# Patient Record
Sex: Male | Born: 1960 | Race: White | Hispanic: No | Marital: Married | Smoking: Former smoker
Health system: Southern US, Community
[De-identification: ages and names within clinical notes are randomized; demographics above are authoritative.]

## PROBLEM LIST (undated history)

## (undated) DIAGNOSIS — E119 Type 2 diabetes mellitus without complications: Secondary | ICD-10-CM

## (undated) DIAGNOSIS — E785 Hyperlipidemia, unspecified: Secondary | ICD-10-CM

## (undated) DIAGNOSIS — I10 Essential (primary) hypertension: Secondary | ICD-10-CM

## (undated) DIAGNOSIS — E7800 Pure hypercholesterolemia, unspecified: Secondary | ICD-10-CM

## (undated) DIAGNOSIS — L570 Actinic keratosis: Secondary | ICD-10-CM

## (undated) DIAGNOSIS — N200 Calculus of kidney: Secondary | ICD-10-CM

## (undated) DIAGNOSIS — Z8673 Personal history of transient ischemic attack (TIA), and cerebral infarction without residual deficits: Secondary | ICD-10-CM

## (undated) DIAGNOSIS — I456 Pre-excitation syndrome: Secondary | ICD-10-CM

## (undated) HISTORY — DX: Hyperlipidemia, unspecified: E78.5

## (undated) HISTORY — PX: FOOT SURGERY: SHX648

## (undated) HISTORY — PX: HERNIA REPAIR: SHX51

## (undated) HISTORY — DX: Personal history of transient ischemic attack (TIA), and cerebral infarction without residual deficits: Z86.73

## (undated) HISTORY — PX: SPLENECTOMY: SUR1306

## (undated) HISTORY — DX: Actinic keratosis: L57.0

## (undated) HISTORY — DX: Calculus of kidney: N20.0

## (undated) HISTORY — PX: CARDIAC CATHETERIZATION: SHX172

---

## 2003-05-15 ENCOUNTER — Other Ambulatory Visit: Payer: Self-pay

## 2007-11-09 ENCOUNTER — Emergency Department: Payer: Self-pay | Admitting: Emergency Medicine

## 2010-09-08 ENCOUNTER — Ambulatory Visit: Payer: Self-pay

## 2011-04-12 ENCOUNTER — Emergency Department: Payer: Self-pay | Admitting: *Deleted

## 2011-04-12 LAB — COMPREHENSIVE METABOLIC PANEL
BUN: 13 mg/dL (ref 7–18)
Bilirubin,Total: 0.6 mg/dL (ref 0.2–1.0)
Chloride: 105 mmol/L (ref 98–107)
Co2: 26 mmol/L (ref 21–32)
EGFR (Non-African Amer.): >60
Glucose: 164 mg/dL — ABNORMAL HIGH (ref 65–99)
Osmolality: 281 (ref 275–301)
Potassium: 3.9 mmol/L (ref 3.5–5.1)
Sodium: 139 mmol/L (ref 136–145)

## 2011-04-12 LAB — URINALYSIS, COMPLETE
Glucose,UR: NEGATIVE mg/dL (ref 0–75)
Leukocyte Esterase: NEGATIVE
Ph: 5 (ref 4.5–8.0)
Protein: 25

## 2011-04-12 LAB — CBC
MCH: 31.8 pg (ref 26.0–34.0)
MCHC: 33.6 g/dL (ref 32.0–36.0)
MCV: 95 fL (ref 80–100)
Platelet: 464 10*3/uL — ABNORMAL HIGH (ref 150–440)
RDW: 12.9 % (ref 11.5–14.5)

## 2011-04-20 ENCOUNTER — Ambulatory Visit: Payer: Self-pay | Admitting: Urology

## 2012-06-01 ENCOUNTER — Encounter: Payer: Self-pay | Admitting: *Deleted

## 2012-07-11 ENCOUNTER — Encounter: Payer: Self-pay | Admitting: Internal Medicine

## 2012-07-11 ENCOUNTER — Ambulatory Visit (INDEPENDENT_AMBULATORY_CARE_PROVIDER_SITE_OTHER): Payer: BC Managed Care – PPO | Admitting: Internal Medicine

## 2012-07-11 VITALS — BP 124/88 | HR 72 | Ht 67.0 in | Wt 187.5 lb

## 2012-07-11 DIAGNOSIS — I456 Pre-excitation syndrome: Secondary | ICD-10-CM | POA: Insufficient documentation

## 2012-07-11 NOTE — Patient Instructions (Addendum)
Your physician wants you to follow-up in: as needed with Dr. Graciela Husbands. You will receive a reminder letter in the mail two months in advance. If you don't receive a letter, please call our office to schedule the follow-up appointment.

## 2012-07-11 NOTE — Progress Notes (Signed)
   ELECTROPHYSIOLOGY CONSULT NOTE  Patient ID: Melvin Torres, MRN: [id number], DOB/AGE: 07/15/1960 52 y.o. Admit date: (Not on file) Date of Consult: 07/11/2012  Primary Physician: Clydell Hakim, MD Primary Cardiologist: new   Chief Complaint: WPW   HPI Melvin Torres is a 52 y.o. male  Referred for consideration of treatment related to asymptomatic preexcitation identified on his electrocardiogram He has no history of palpitations. He has no syncope.  He has no problems with exercise tolerance without shortness of breath chest pain or peripheral edema.    Past Medical History  Diagnosis Date  . AK (actinic keratosis)   . Kidney stones   . History of TIA (transient ischemic attack)   . Hyperlipidemia       Surgical History:  Past Surgical History  Procedure Laterality Date  . Hernia repair      x2  . Splenectomy       Home Meds: Prior to Admission medications   Medication Sig Start Date End Date Taking? Authorizing Provider  aspirin 81 MG tablet Take 81 mg by mouth daily.   Yes Historical Provider, MD     Allergies: No Known Allergies  History   Social History  . Marital Status: Married    Spouse Name: N/A    Number of Children: N/A  . Years of Education: N/A   Occupational History  . Not on file.   Social History Main Topics  . Smoking status: Never Smoker   . Smokeless tobacco: Not on file  . Alcohol Use: No  . Drug Use: No  . Sexually Active: Not on file   Other Topics Concern  . Not on file   Social History Narrative  . No narrative on file     Family History  Problem Relation Age of Onset  . Heart disease Mother   . Heart attack Father   . Heart disease Father      ROS:  Please see the history of present illness.     All other systems reviewed and negative.    Physical Exam:   Blood pressure 124/88, pulse 72, height 5\' 7"  (1.702 m), weight 187 lb 8 oz (85.049 kg). General: Well developed, well nourished male in no acute  distress.  Alert and oriented in no acute distress HENT- normal Eyes- EOMI, without scleral icterus Skin- warm and dry; without rashes LN-neg Neck- supple without thyromegaly, JVP-flat, carotids brisk and full without bruits Back-without CVAT or kyphosis Lungs-clear to auscultation CV-Regular rate and rhythm, nl S1 and S2, no murmurs gallops or rubs, S4-absent Abd-soft with active bowel sounds; no midline pulsation or hepatomegaly Pulses-intact femoral and distal MKS-without gross deformity Neuro- Ax O, CN3-12 intact, grossly normal motor and sensory function Affect engaging    Labs:  BNP No results found for this basename: probnp   Miscellaneous No results found for this basename: DDIMER    Radiology/Studies:  No results found.  EKG: sinus rhythm at 72 Intervals 12/23/37 Ventricular preexcitation  Assessment and Plan: *   Sherryl Manges

## 2012-07-11 NOTE — Assessment & Plan Note (Signed)
The patient is asymptomatic ventricular preexcitation.  We reviewed the physiology and the concern is related to atrial fibrillation  And antegrade conduction over accessory pathway and the associated risk of cardiac arrest. It is thought  almost all patients present with palpitations and as such patients who are asymptomatic to be followed for the development of palpitations prior to needing to proceed with more definitive therapy.  At this juncture we will follow him along.

## 2012-08-15 ENCOUNTER — Ambulatory Visit: Payer: Self-pay | Admitting: Family Medicine

## 2012-08-15 LAB — DOT URINE DIP
Blood: NEGATIVE
Protein: NEGATIVE

## 2014-04-26 ENCOUNTER — Encounter: Payer: Self-pay | Admitting: Family Medicine

## 2014-04-26 DIAGNOSIS — E1142 Type 2 diabetes mellitus with diabetic polyneuropathy: Secondary | ICD-10-CM | POA: Insufficient documentation

## 2014-04-26 DIAGNOSIS — I10 Essential (primary) hypertension: Secondary | ICD-10-CM | POA: Insufficient documentation

## 2014-04-26 DIAGNOSIS — E785 Hyperlipidemia, unspecified: Secondary | ICD-10-CM | POA: Insufficient documentation

## 2014-04-26 DIAGNOSIS — G458 Other transient cerebral ischemic attacks and related syndromes: Secondary | ICD-10-CM | POA: Insufficient documentation

## 2014-04-26 DIAGNOSIS — Z9081 Acquired absence of spleen: Secondary | ICD-10-CM | POA: Insufficient documentation

## 2014-04-26 DIAGNOSIS — E1140 Type 2 diabetes mellitus with diabetic neuropathy, unspecified: Secondary | ICD-10-CM | POA: Insufficient documentation

## 2014-08-07 ENCOUNTER — Ambulatory Visit
Admission: EM | Admit: 2014-08-07 | Discharge: 2014-08-07 | Disposition: A | Discharge status: Home / Self Care | Payer: Self-pay | Attending: Emergency Medicine | Admitting: Emergency Medicine

## 2014-08-07 DIAGNOSIS — Z0000 Encounter for general adult medical examination without abnormal findings: Secondary | ICD-10-CM

## 2014-08-07 DIAGNOSIS — Z029 Encounter for administrative examinations, unspecified: Secondary | ICD-10-CM

## 2014-08-07 HISTORY — DX: Pure hypercholesterolemia, unspecified: E78.00

## 2014-08-07 HISTORY — DX: Essential (primary) hypertension: I10

## 2014-08-07 HISTORY — DX: Type 2 diabetes mellitus without complications: E11.9

## 2014-08-07 LAB — DEPT OF TRANSP DIPSTICK, URINE (ARMC ONLY)
Glucose, UA: NEGATIVE mg/dL
Hgb urine dipstick: NEGATIVE
Protein, ur: NEGATIVE mg/dL
Specific Gravity, Urine: 1.02 (ref 1.005–1.030)

## 2014-08-07 NOTE — ED Notes (Signed)
For DOT Physical

## 2014-08-07 NOTE — ED Provider Notes (Signed)
CSN: [id number]     Arrival date & time 08/07/14  5916 History   First MD Initiated Contact with Patient 08/07/14 1113     Chief Complaint  Patient presents with  . Employment Physical   (Consider location/radiation/quality/duration/timing/severity/associated sxs/prior Treatment) HPI  Patient presents for a DOT physical.  Past Medical History  Diagnosis Date  . AK (actinic keratosis)   . Kidney stones   . History of TIA (transient ischemic attack)   . Hyperlipidemia   . Diabetes mellitus without complication   . Hypercholesteremia   . Hypertension    Past Surgical History  Procedure Laterality Date  . Hernia repair      x2  . Splenectomy     Family History  Problem Relation Age of Onset  . Heart disease Mother   . Heart attack Father   . Heart disease Father    History  Substance Use Topics  . Smoking status: Never Smoker   . Smokeless tobacco: Not on file  . Alcohol Use: No    Review of Systems  All other systems reviewed and are negative.   Allergies  Review of patient's allergies indicates no known allergies.  Home Medications   Prior to Admission medications   Medication Sig Start Date End Date Taking? Authorizing Provider  aspirin 81 MG tablet Take 81 mg by mouth daily.   Yes Historical Provider, MD  lisinopril (PRINIVIL,ZESTRIL) 5 MG tablet Take 1 tablet by mouth daily. 03/26/14  Yes Historical Provider, MD  metFORMIN (GLUCOPHAGE) 1000 MG tablet Take 1 tablet by mouth 2 (two) times daily. 04/03/14  Yes Historical Provider, MD  simvastatin (ZOCOR) 40 MG tablet Take 1 tablet by mouth daily. 04/03/14  Yes Historical Provider, MD   BP 138/86 mmHg  Pulse 97  Temp(Src) 97.8 F (36.6 C) (Tympanic)  Resp 18  Ht 5' 7.4" (1.712 m)  Wt 180 lb (81.647 kg)  BMI 27.86 kg/m2  SpO2 100% Physical Exam  Constitutional:  Refer to DOT report  Nursing note and vitals reviewed.   ED Course  Procedures (including critical care time) Labs Review Labs Reviewed   DEPT OF TRANSP DIPSTICK, URINE([facility] ONLY)    Imaging Review No results found.   MDM   1. Physical exam    Patient was given one year certificate due to his hypertension and non-insulin-dependent diabetes mellitus. His TIA has been stable since age 54 and he has been released from further follow-up from the vascular department. He has not had any symptoms.    Lorin Picket, PA-C 08/07/14 1234

## 2014-10-03 ENCOUNTER — Ambulatory Visit (INDEPENDENT_AMBULATORY_CARE_PROVIDER_SITE_OTHER): Payer: BLUE CROSS/BLUE SHIELD | Admitting: Family Medicine

## 2014-10-03 ENCOUNTER — Encounter: Payer: Self-pay | Admitting: Family Medicine

## 2014-10-03 VITALS — BP 138/88 | HR 64 | Ht 67.0 in | Wt 178.2 lb

## 2014-10-03 DIAGNOSIS — Z23 Encounter for immunization: Secondary | ICD-10-CM | POA: Diagnosis not present

## 2014-10-03 DIAGNOSIS — E1140 Type 2 diabetes mellitus with diabetic neuropathy, unspecified: Secondary | ICD-10-CM

## 2014-10-03 DIAGNOSIS — E785 Hyperlipidemia, unspecified: Secondary | ICD-10-CM

## 2014-10-03 DIAGNOSIS — G458 Other transient cerebral ischemic attacks and related syndromes: Secondary | ICD-10-CM

## 2014-10-03 DIAGNOSIS — I10 Essential (primary) hypertension: Secondary | ICD-10-CM

## 2014-10-03 DIAGNOSIS — B354 Tinea corporis: Secondary | ICD-10-CM | POA: Diagnosis not present

## 2014-10-03 DIAGNOSIS — G459 Transient cerebral ischemic attack, unspecified: Secondary | ICD-10-CM

## 2014-10-03 MED ORDER — SIMVASTATIN 40 MG PO TABS: 40.0000 mg | ORAL_TABLET | Freq: Every day | ORAL | Status: DC

## 2014-10-03 MED ORDER — LISINOPRIL 10 MG PO TABS: 10.0000 mg | ORAL_TABLET | Freq: Every day | ORAL | Status: DC

## 2014-10-03 MED ORDER — METFORMIN HCL 1000 MG PO TABS: 1000.0000 mg | ORAL_TABLET | Freq: Two times a day (BID) | ORAL | Status: DC

## 2014-10-03 MED ORDER — CICLOPIROX OLAMINE 0.77 % EX CREA: TOPICAL_CREAM | Freq: Two times a day (BID) | CUTANEOUS | Status: DC

## 2014-10-03 NOTE — Progress Notes (Signed)
Date:  10/03/2014   Name:  Melvin KERNEY Sr.   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 Passed DOT PE recently but MD questioned when last carotid US done (none in 10 yrs). Using compounded gel of mother's for neuropathic pain prn, works well, wants to avoid another oral med. Has lesions L forearm wants evaluated, itch esp when exposed to sunlight. Due for flu shot and labs today. Saw cardiology re: WPW, wants to monitor only.  Review of Systems:  Review of Systems  Respiratory: Negative for shortness of breath.   Cardiovascular: Negative for chest pain and leg swelling.  Endocrine: Negative for polyuria.  Genitourinary: Negative for difficulty urinating.  Neurological: Negative for tremors, syncope and weakness.    Patient Active Problem List   Diagnosis Date Noted  . Tinea corporis 10/03/2014  . Diabetes mellitus, type 2 04/26/2014  . Diabetic peripheral neuropathy 04/26/2014  . HLD (hyperlipidemia) 04/26/2014  . HTN (hypertension) 04/26/2014  . H/O splenectomy 04/26/2014  . Anterior circulation transient ischemic attack 04/26/2014  . WPW (Wolff-Parkinson-White syndrome) 07/11/2012    Prior to Admission medications   Medication Sig Start Date End Date Taking? Authorizing Provider  aspirin 81 MG tablet Take 81 mg by mouth daily.   Yes Historical Provider, MD  metFORMIN (GLUCOPHAGE) 1000 MG tablet Take 1 tablet (1,000 mg total) by mouth 2 (two) times daily. 10/03/14  Yes Adline Potter, MD  simvastatin (ZOCOR) 40 MG tablet Take 1 tablet (40 mg total) by mouth daily. 10/03/14  Yes Adline Potter, MD  lisinopril (PRINIVIL,ZESTRIL) 10 MG tablet Take 1 tablet (10 mg total) by mouth daily. 10/03/14   Adline Potter, MD    No Known Allergies  Past Surgical History  Procedure Laterality Date   . Hernia repair      x2  . Splenectomy      Social History  Substance Use Topics  . Smoking status: Never Smoker   . Smokeless tobacco: None  . Alcohol Use: No    Family History  Problem Relation Age of Onset  . Heart disease Mother   . Heart attack Father   . Heart disease Father     Medication list has been reviewed and updated.  Physical Examination: BP 138/88 mmHg  Pulse 64  Ht 5\' 7"  (1.702 m)  Wt 178 lb 3.2 oz (80.831 kg)  BMI 27.90 kg/m2  Physical Exam  Constitutional: He appears well-developed and well-nourished.  Neck:  No carotid bruits  Cardiovascular: Normal rate, regular rhythm and normal heart sounds.   Pulmonary/Chest: Effort normal and breath sounds normal.  Musculoskeletal: He exhibits no edema.  Neurological: He is alert. Coordination normal.  Skin: Skin is warm and dry.  Scattered scaly erythematous plaques over L forearm  Psychiatric: He has a normal mood and affect. His behavior is normal.    Assessment and Plan:  1. Type 2 diabetes mellitus with diabetic neuropathy Well controlled on metformin, check labs today, ok to use topical gel for neuropathic sxs - HgB A1c - Urine Microalbumin w/creat. ratio  2. Essential hypertension Marginal control for diabetic, increase lisinopril to 10 mg daily - Comprehensive metabolic panel - CBC  3. HLD (hyperlipidemia) Zocor new, tolerating clinically, check labs - Lipid Profile  4. Anterior circulation transient ischemic attack Continue asa, check  carotid US - US Carotid Duplex Bilateral; Future  5. Tinea corporis Trial Loprox, consider derm referral for AK's if fail to clear given persistent L forearm sun exposure   6. Flu vaccine need - Flu Vaccine QUAD 36+ mos PF IM (Fluarix & Fluzone Quad PF)   Return in about 6 months (around 04/02/2015).  Satira Anis. [facility]  10/03/2014

## 2014-10-04 ENCOUNTER — Other Ambulatory Visit: Payer: Self-pay

## 2014-10-04 LAB — LIPID PANEL
CHOL/HDL RATIO: 4.1 ratio (ref 0.0–5.0)
Cholesterol, Total: 191 mg/dL (ref 100–199)
HDL: 47 mg/dL (ref >39)
LDL CALC: 109 mg/dL — AB (ref 0–99)
Triglycerides: 174 mg/dL — ABNORMAL HIGH (ref 0–149)
VLDL Cholesterol Cal: 35 mg/dL (ref 5–40)

## 2014-10-04 LAB — CBC
Hematocrit: 45.3 % (ref 37.5–51.0)
Hemoglobin: 15.4 g/dL (ref 12.6–17.7)
MCH: 31.6 pg (ref 26.6–33.0)
MCHC: 34 g/dL (ref 31.5–35.7)
MCV: 93 fL (ref 79–97)
PLATELETS: 571 10*3/uL — AB (ref 150–379)
RBC: 4.88 x10E6/uL (ref 4.14–5.80)
RDW: 13.5 % (ref 12.3–15.4)
WBC: 9.1 10*3/uL (ref 3.4–10.8)

## 2014-10-04 LAB — COMPREHENSIVE METABOLIC PANEL
ALBUMIN: 4.4 g/dL (ref 3.5–5.5)
ALT: 21 IU/L (ref 0–44)
AST: 16 IU/L (ref 0–40)
Albumin/Globulin Ratio: 1.8 (ref 1.1–2.5)
Alkaline Phosphatase: 63 IU/L (ref 39–117)
BILIRUBIN TOTAL: 0.3 mg/dL (ref 0.0–1.2)
BUN / CREAT RATIO: 11 (ref 9–20)
BUN: 12 mg/dL (ref 6–24)
CO2: 25 mmol/L (ref 18–29)
Calcium: 9.7 mg/dL (ref 8.7–10.2)
Chloride: 99 mmol/L (ref 97–108)
Creatinine, Ser: 1.14 mg/dL (ref 0.76–1.27)
GFR calc Af Amer: 84 mL/min/{1.73_m2} (ref >59)
GFR calc non Af Amer: 73 mL/min/{1.73_m2} (ref >59)
GLOBULIN, TOTAL: 2.5 g/dL (ref 1.5–4.5)
GLUCOSE: 154 mg/dL — AB (ref 65–99)
Potassium: 4.5 mmol/L (ref 3.5–5.2)
SODIUM: 140 mmol/L (ref 134–144)
Total Protein: 6.9 g/dL (ref 6.0–8.5)

## 2014-10-04 LAB — MICROALBUMIN / CREATININE URINE RATIO
CREATININE, UR: 98 mg/dL
MICROALB/CREAT RATIO: 6.2 mg/g creat (ref 0.0–30.0)
Microalbumin, Urine: 6.1 ug/mL

## 2014-10-04 LAB — HEMOGLOBIN A1C
ESTIMATED AVERAGE GLUCOSE: 180 mg/dL
Hgb A1c MFr Bld: 7.9 % — ABNORMAL HIGH (ref 4.8–5.6)

## 2014-10-04 MED ORDER — GLIPIZIDE 5 MG PO TABS: 5.0000 mg | ORAL_TABLET | Freq: Every day | ORAL | Status: DC

## 2014-10-04 NOTE — Addendum Note (Signed)
Addended by: Adline Potter on: 10/04/2014 09:24 AM   Modules accepted: Orders

## 2014-11-02 ENCOUNTER — Ambulatory Visit
Admission: RE | Admit: 2014-11-02 | Discharge: 2014-11-02 | Disposition: A | Discharge status: Home / Self Care | Payer: BLUE CROSS/BLUE SHIELD | Source: Ambulatory Visit | Attending: Family Medicine | Admitting: Family Medicine

## 2014-11-02 DIAGNOSIS — G458 Other transient cerebral ischemic attacks and related syndromes: Secondary | ICD-10-CM

## 2014-11-02 DIAGNOSIS — G459 Transient cerebral ischemic attack, unspecified: Secondary | ICD-10-CM | POA: Insufficient documentation

## 2014-11-02 DIAGNOSIS — I6523 Occlusion and stenosis of bilateral carotid arteries: Secondary | ICD-10-CM | POA: Insufficient documentation

## 2015-04-02 ENCOUNTER — Ambulatory Visit: Payer: BLUE CROSS/BLUE SHIELD | Admitting: Family Medicine

## 2015-04-12 ENCOUNTER — Ambulatory Visit (INDEPENDENT_AMBULATORY_CARE_PROVIDER_SITE_OTHER): Payer: BLUE CROSS/BLUE SHIELD | Admitting: Family Medicine

## 2015-04-12 ENCOUNTER — Telehealth: Payer: Self-pay

## 2015-04-12 ENCOUNTER — Encounter: Payer: Self-pay | Admitting: Family Medicine

## 2015-04-12 VITALS — BP 124/84 | HR 76 | Resp 16 | Wt 184.0 lb

## 2015-04-12 DIAGNOSIS — E785 Hyperlipidemia, unspecified: Secondary | ICD-10-CM | POA: Diagnosis not present

## 2015-04-12 DIAGNOSIS — I10 Essential (primary) hypertension: Secondary | ICD-10-CM

## 2015-04-12 DIAGNOSIS — E663 Overweight: Secondary | ICD-10-CM | POA: Diagnosis not present

## 2015-04-12 DIAGNOSIS — Z23 Encounter for immunization: Secondary | ICD-10-CM | POA: Diagnosis not present

## 2015-04-12 DIAGNOSIS — B354 Tinea corporis: Secondary | ICD-10-CM

## 2015-04-12 DIAGNOSIS — I6522 Occlusion and stenosis of left carotid artery: Secondary | ICD-10-CM

## 2015-04-12 DIAGNOSIS — E1140 Type 2 diabetes mellitus with diabetic neuropathy, unspecified: Secondary | ICD-10-CM

## 2015-04-12 MED ORDER — BLOOD GLUCOSE METER KIT: PACK | Status: AC

## 2015-04-12 NOTE — Addendum Note (Signed)
Addended by: Adline Potter on: 04/12/2015 10:53 AM   Modules accepted: Orders

## 2015-04-12 NOTE — Telephone Encounter (Signed)
Sent to [place] 

## 2015-04-13 LAB — COMPREHENSIVE METABOLIC PANEL
ALT: 17 IU/L (ref 0–44)
AST: 16 IU/L (ref 0–40)
Albumin/Globulin Ratio: 1.4 (ref 1.2–2.2)
Albumin: 4.2 g/dL (ref 3.5–5.5)
Alkaline Phosphatase: 65 IU/L (ref 39–117)
BUN / CREAT RATIO: 9 (ref 9–20)
BUN: 10 mg/dL (ref 6–24)
Bilirubin Total: 0.4 mg/dL (ref 0.0–1.2)
CALCIUM: 10.1 mg/dL (ref 8.7–10.2)
CHLORIDE: 100 mmol/L (ref 96–106)
CO2: 20 mmol/L (ref 18–29)
Creatinine, Ser: 1.1 mg/dL (ref 0.76–1.27)
GFR calc non Af Amer: 75 mL/min/{1.73_m2} (ref >59)
GFR, EST AFRICAN AMERICAN: 87 mL/min/{1.73_m2} (ref >59)
GLOBULIN, TOTAL: 2.9 g/dL (ref 1.5–4.5)
Glucose: 107 mg/dL — ABNORMAL HIGH (ref 65–99)
POTASSIUM: 4.8 mmol/L (ref 3.5–5.2)
SODIUM: 141 mmol/L (ref 134–144)
TOTAL PROTEIN: 7.1 g/dL (ref 6.0–8.5)

## 2015-04-13 LAB — HEMOGLOBIN A1C
Est. average glucose Bld gHb Est-mCnc: 177 mg/dL
HEMOGLOBIN A1C: 7.8 % — AB (ref 4.8–5.6)

## 2015-04-13 LAB — VITAMIN D 25 HYDROXY (VIT D DEFICIENCY, FRACTURES): VIT D 25 HYDROXY: 29.6 ng/mL — AB (ref 30.0–100.0)

## 2015-04-13 LAB — VITAMIN B12: VITAMIN B 12: 418 pg/mL (ref 211–946)

## 2015-04-13 LAB — LIPID PANEL
Chol/HDL Ratio: 3.4 ratio units (ref 0.0–5.0)
Cholesterol, Total: 185 mg/dL (ref 100–199)
HDL: 54 mg/dL (ref >39)
LDL Calculated: 112 mg/dL — ABNORMAL HIGH (ref 0–99)
Triglycerides: 95 mg/dL (ref 0–149)
VLDL Cholesterol Cal: 19 mg/dL (ref 5–40)

## 2015-04-16 MED ORDER — BLOOD GLUCOSE MONITOR KIT: PACK | Status: DC

## 2015-04-16 MED ORDER — ATORVASTATIN CALCIUM 40 MG PO TABS: 40.0000 mg | ORAL_TABLET | Freq: Every day | ORAL | Status: DC

## 2015-04-16 MED ORDER — GLIPIZIDE 5 MG PO TABS: 5.0000 mg | ORAL_TABLET | Freq: Two times a day (BID) | ORAL | Status: DC

## 2015-04-16 NOTE — Addendum Note (Signed)
Addended by: Adline Potter on: 04/16/2015 04:38 PM   Modules accepted: Orders, Medications

## 2015-04-16 NOTE — Progress Notes (Signed)
Date:  04/12/2015   Name:  Melvin YAMADA Sr.   DOB:  [birth year]   MRN:  [id number]  PCP:  Adline Potter, MD    Chief Complaint: Diabetes and Rash   History of Present Illness:  This is a 55 y.o. male seen for 6 month f/u. A1c 7.9% in September on glipizide and metformin, not check home BG's. Neuropathy sxs a little worse but reluctant to take another med. Lisinopril dose increased last visit, tolerating well. On Zocor for HLD. Recent carotid US showed mod L sided stenosis (about 50%). Has lesions on arms unresponsive to Loprox.  Review of Systems:  Review of Systems  Constitutional: Negative for fever and fatigue.  Respiratory: Negative for cough and shortness of breath.   Cardiovascular: Negative for chest pain and leg swelling.  Endocrine: Negative for polyuria.  Genitourinary: Negative for difficulty urinating.  Neurological: Negative for syncope and light-headedness.    Patient Active Problem List   Diagnosis Date Noted  . Overweight 04/12/2015  . Left carotid stenosis 04/12/2015  . Tinea corporis 10/03/2014  . Type 2 diabetes mellitus with diabetic neuropathy ([place]) 04/26/2014  . HLD (hyperlipidemia) 04/26/2014  . HTN (hypertension) 04/26/2014  . H/O splenectomy 04/26/2014  . Anterior circulation transient ischemic attack 04/26/2014  . WPW (Wolff-Parkinson-White syndrome) 07/11/2012    Prior to Admission medications   Medication Sig Start Date End Date Taking? Authorizing Provider  aspirin 81 MG tablet Take 81 mg by mouth daily.   Yes Historical Provider, MD  ciclopirox (LOPROX) 0.77 % cream Apply topically 2 (two) times daily. 10/03/14  Yes Adline Potter, MD  glipiZIDE (GLUCOTROL) 5 MG tablet Take 1 tablet (5 mg total) by mouth daily before breakfast. 10/04/14  Yes Adline Potter, MD  lisinopril (PRINIVIL,ZESTRIL) 10 MG tablet Take 1 tablet (10 mg total) by mouth daily. 10/03/14  Yes Adline Potter, MD  metFORMIN (GLUCOPHAGE) 1000 MG tablet Take 1 tablet (1,000 mg total)  by mouth 2 (two) times daily. 10/03/14  Yes Adline Potter, MD  simvastatin (ZOCOR) 40 MG tablet Take 1 tablet (40 mg total) by mouth daily. 10/03/14  Yes Adline Potter, MD  blood glucose meter kit and supplies Dispense based on patient and insurance preference. Use up to four times daily as directed. (FOR ICD-9 250.00, 250.01). 04/12/15   Adline Potter, MD    No Known Allergies  Past Surgical History  Procedure Laterality Date  . Hernia repair      x2  . Splenectomy      Social History  Substance Use Topics  . Smoking status: Never Smoker   . Smokeless tobacco: None  . Alcohol Use: No    Family History  Problem Relation Age of Onset  . Heart disease Mother   . Heart attack Father   . Heart disease Father     Medication list has been reviewed and updated.  Physical Examination: BP 124/84 mmHg  Pulse 76  Resp 16  Wt 184 lb (83.462 kg)  SpO2 96%  Physical Exam  Constitutional: He appears well-developed and well-nourished.  Cardiovascular: Normal rate, regular rhythm and normal heart sounds.   Pulmonary/Chest: Effort normal and breath sounds normal.  Musculoskeletal: He exhibits no edema.  Neurological: He is alert.  Skin: Skin is warm and dry.  Psychiatric: He has a normal mood and affect. His behavior is normal.  Nursing note and vitals reviewed.   Assessment and Plan:  1. Type 2 diabetes mellitus with diabetic neuropathy, without long-term current use of insulin ([place])  Well controlled on glipizide/metformin, rx for glucometer sent, consider gabapentin if neuropathy sxs worsen - HgB A1c - B12  2. Overweight Exercise/weight loss discussed - Vitamin D (25 hydroxy)  3. Essential hypertension Well controlled on lisinopril - Comprehensive Metabolic Panel (CMET)  4. Left carotid stenosis Stable, cont asa/statin  5. HLD (hyperlipidemia) On Zocor - Lipid Profile  6. Need for pneumococcal vaccination - Pneumococcal polysaccharide vaccine 23-valent greater than  or equal to 2yo subcutaneous/IM  7. Tinea corporis Unresponsive to topical antifungal, refer derm  Return in about 3 months (around 07/12/2015).  Satira Anis. [facility]  04/16/2015

## 2015-05-10 DIAGNOSIS — L538 Other specified erythematous conditions: Secondary | ICD-10-CM | POA: Diagnosis not present

## 2015-05-10 DIAGNOSIS — L570 Actinic keratosis: Secondary | ICD-10-CM | POA: Diagnosis not present

## 2015-05-10 DIAGNOSIS — Z789 Other specified health status: Secondary | ICD-10-CM | POA: Diagnosis not present

## 2015-05-10 DIAGNOSIS — L298 Other pruritus: Secondary | ICD-10-CM | POA: Diagnosis not present

## 2015-05-10 DIAGNOSIS — B078 Other viral warts: Secondary | ICD-10-CM | POA: Diagnosis not present

## 2015-06-06 DIAGNOSIS — L570 Actinic keratosis: Secondary | ICD-10-CM | POA: Diagnosis not present

## 2015-07-04 DIAGNOSIS — L570 Actinic keratosis: Secondary | ICD-10-CM | POA: Diagnosis not present

## 2015-07-12 ENCOUNTER — Ambulatory Visit (INDEPENDENT_AMBULATORY_CARE_PROVIDER_SITE_OTHER): Payer: BLUE CROSS/BLUE SHIELD | Admitting: Family Medicine

## 2015-07-12 ENCOUNTER — Encounter: Payer: Self-pay | Admitting: Family Medicine

## 2015-07-12 VITALS — BP 148/90 | HR 78 | Ht 64.0 in | Wt 187.0 lb

## 2015-07-12 DIAGNOSIS — I10 Essential (primary) hypertension: Secondary | ICD-10-CM | POA: Diagnosis not present

## 2015-07-12 DIAGNOSIS — E669 Obesity, unspecified: Secondary | ICD-10-CM | POA: Diagnosis not present

## 2015-07-12 DIAGNOSIS — E785 Hyperlipidemia, unspecified: Secondary | ICD-10-CM | POA: Diagnosis not present

## 2015-07-12 DIAGNOSIS — E1140 Type 2 diabetes mellitus with diabetic neuropathy, unspecified: Secondary | ICD-10-CM | POA: Diagnosis not present

## 2015-07-12 DIAGNOSIS — E66811 Obesity, class 1: Secondary | ICD-10-CM | POA: Insufficient documentation

## 2015-07-12 MED ORDER — LISINOPRIL 20 MG PO TABS: 20.0000 mg | ORAL_TABLET | Freq: Every day | ORAL | Status: DC

## 2015-07-12 NOTE — Progress Notes (Signed)
Date:  07/12/2015   Name:  Melvin LAURSEN Sr.   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 T2DM marginally controlled on metformin/glipizide, glipizide dose increased after last visit. HLD marginally controlled on Zocor, changed to Lipitor after last visit but has not changed over yet as waiting to use up Zocor rx. Seeing derm for multiple precancerous L arm lesions. Reports FBG's around 120.  Review of Systems:  Review of Systems  Constitutional: Negative for fever and fatigue.  Respiratory: Negative for cough and shortness of breath.   Cardiovascular: Negative for chest pain and leg swelling.  Endocrine: Negative for polyuria.  Genitourinary: Negative for difficulty urinating.  Neurological: Negative for syncope and light-headedness.    Patient Active Problem List   Diagnosis Date Noted  . Obesity, Class I, BMI 30-34.9 07/12/2015  . Overweight 04/12/2015  . Left carotid stenosis 04/12/2015  . Tinea corporis 10/03/2014  . Type 2 diabetes mellitus with diabetic neuropathy ([place]) 04/26/2014  . HLD (hyperlipidemia) 04/26/2014  . HTN (hypertension) 04/26/2014  . H/O splenectomy 04/26/2014  . Anterior circulation transient ischemic attack 04/26/2014  . WPW (Wolff-Parkinson-White syndrome) 07/11/2012    Prior to Admission medications   Medication Sig Start Date End Date Taking? Authorizing Provider  aspirin 81 MG tablet Take 81 mg by mouth daily.   Yes Historical Provider, MD  atorvastatin (LIPITOR) 40 MG tablet Take 1 tablet (40 mg total) by mouth daily. 04/16/15  Yes Adline Potter, MD  blood glucose meter kit and supplies Dispense based on patient and insurance preference. Use up to four times daily as directed. (FOR ICD-9 250.00, 250.01). 04/12/15  Yes Adline Potter, MD  glipiZIDE (GLUCOTROL) 5 MG tablet Take  1 tablet (5 mg total) by mouth 2 (two) times daily before a meal. 04/16/15  Yes Adline Potter, MD  lisinopril (PRINIVIL,ZESTRIL) 20 MG tablet Take 1 tablet (20 mg total) by mouth daily. 07/12/15  Yes Adline Potter, MD  metFORMIN (GLUCOPHAGE) 1000 MG tablet Take 1 tablet (1,000 mg total) by mouth 2 (two) times daily. 10/03/14  Yes Adline Potter, MD    No Known Allergies  Past Surgical History  Procedure Laterality Date  . Hernia repair      x2  . Splenectomy      Social History  Substance Use Topics  . Smoking status: Never Smoker   . Smokeless tobacco: None  . Alcohol Use: No    Family History  Problem Relation Age of Onset  . Heart disease Mother   . Heart attack Father   . Heart disease Father     Medication list has been reviewed and updated.  Physical Examination: BP 148/90 mmHg  Pulse 78  Ht '5\' 4"'  (1.626 m)  Wt 187 lb (84.823 kg)  BMI 32.08 kg/m2  Physical Exam  Constitutional: He appears well-developed and well-nourished.  Cardiovascular: Normal rate, regular rhythm and normal heart sounds.   Pulmonary/Chest: Effort normal and breath sounds normal.  Musculoskeletal: He exhibits no edema.  Neurological: He is alert.  Skin: Skin is warm and dry.  Psychiatric: He has a normal mood and affect. His behavior is normal.  Nursing note and vitals reviewed.   Assessment and Plan:  1. Type 2 diabetes mellitus with diabetic neuropathy, without long-term current use of insulin ([name]) Unclear control on  increased glipizide - HgB A1c  2. Essential hypertension Poor control today, increase lisinopril to 20 mg daily  3. HLD (hyperlipidemia) Marginal control on Zocor, change to Lipitor 40 mg daily  4. Obesity, Class I, BMI 30-34.9 Exercise/weight loss discussed - TSH  Return in about 3 months (around 10/12/2015).  Satira Anis. [facility]  07/12/2015

## 2015-07-13 LAB — HEMOGLOBIN A1C
ESTIMATED AVERAGE GLUCOSE: 180 mg/dL
HEMOGLOBIN A1C: 7.9 % — AB (ref 4.8–5.6)

## 2015-07-13 LAB — TSH: TSH: 1.18 u[IU]/mL (ref 0.450–4.500)

## 2015-07-14 ENCOUNTER — Other Ambulatory Visit: Payer: Self-pay | Admitting: Family Medicine

## 2015-07-14 MED ORDER — GLIPIZIDE 10 MG PO TABS: 10.0000 mg | ORAL_TABLET | Freq: Two times a day (BID) | ORAL | Status: DC

## 2015-07-22 ENCOUNTER — Ambulatory Visit
Admission: EM | Admit: 2015-07-22 | Discharge: 2015-07-22 | Disposition: A | Discharge status: Home / Self Care | Payer: BLUE CROSS/BLUE SHIELD | Attending: Family Medicine | Admitting: Family Medicine

## 2015-07-22 ENCOUNTER — Other Ambulatory Visit: Payer: Self-pay

## 2015-07-22 DIAGNOSIS — Z0289 Encounter for other administrative examinations: Secondary | ICD-10-CM

## 2015-07-22 LAB — DEPT OF TRANSP DIPSTICK, URINE (ARMC ONLY)
Glucose, UA: NEGATIVE mg/dL
Hgb urine dipstick: NEGATIVE
Protein, ur: NEGATIVE mg/dL
Specific Gravity, Urine: 1.005 (ref 1.005–1.030)

## 2015-07-22 NOTE — ED Provider Notes (Signed)
CSN: [id number]     Arrival date & time 07/22/15  7915 History   First MD Initiated Contact with Patient 07/22/15 [phone]     Chief Complaint  Patient presents with  . Commercial Driver's License Exam   (Consider location/radiation/quality/duration/timing/severity/associated sxs/prior Treatment) HPI Comments: Patient here for DOT Physical (see scanned form)   The history is provided by the patient.    Past Medical History  Diagnosis Date  . AK (actinic keratosis)   . Kidney stones   . History of TIA (transient ischemic attack)   . Hyperlipidemia   . Diabetes mellitus without complication ([place])   . Hypercholesteremia   . Hypertension    Past Surgical History  Procedure Laterality Date  . Hernia repair      x2  . Splenectomy    . Foot surgery     Family History  Problem Relation Age of Onset  . Heart disease Mother   . Heart attack Father   . Heart disease Father    Social History  Substance Use Topics  . Smoking status: Former Research scientist (life sciences)  . Smokeless tobacco: None  . Alcohol Use: No    Review of Systems  Allergies  Review of patient's allergies indicates no known allergies.  Home Medications   Prior to Admission medications   Medication Sig Start Date End Date Taking? Authorizing Provider  aspirin 81 MG tablet Take 81 mg by mouth daily.   Yes Historical Provider, MD  atorvastatin (LIPITOR) 40 MG tablet Take 1 tablet (40 mg total) by mouth daily. 04/16/15  Yes Adline Potter, MD  blood glucose meter kit and supplies Dispense based on patient and insurance preference. Use up to four times daily as directed. (FOR ICD-9 250.00, 250.01). 04/12/15  Yes Adline Potter, MD  glipiZIDE (GLUCOTROL) 10 MG tablet Take 1 tablet (10 mg total) by mouth 2 (two) times daily before a meal. 07/14/15  Yes Adline Potter, MD  lisinopril (PRINIVIL,ZESTRIL) 20 MG tablet Take 1 tablet (20 mg total) by mouth daily. 07/12/15  Yes Adline Potter, MD  metFORMIN (GLUCOPHAGE) 1000 MG tablet Take 1 tablet  (1,000 mg total) by mouth 2 (two) times daily. 10/03/14  Yes Adline Potter, MD   Meds Ordered and Administered this Visit  Medications - No data to display  BP 155/84 mmHg  Pulse 96  Temp(Src) 97.7 F (36.5 C) (Oral)  Resp 17  Ht '5\' 7"'  (1.702 m)  Wt 187 lb (84.823 kg)  BMI 29.28 kg/m2  SpO2 100% No data found.   Physical Exam  ED Course  Procedures (including critical care time)  Labs Review Labs Reviewed  DEPT OF TRANSP DIPSTICK, URINE([facility] ONLY)    Imaging Review No results found.   Visual Acuity Review  Right Eye Distance: 20/30 Left Eye Distance: 20/25 Bilateral Distance: 20/20  Right Eye Near:   Left Eye Near:    Bilateral Near:         MDM   1. Encounter for examination required by Department of Transportation (DOT)     DOT Physical (medically qualified for 1 year; see scanned form)    Norval Gable, MD 07/22/15 1015

## 2015-07-22 NOTE — ED Notes (Signed)
DOT Physical

## 2015-08-23 ENCOUNTER — Emergency Department: Payer: BLUE CROSS/BLUE SHIELD

## 2015-08-23 ENCOUNTER — Emergency Department
Admission: EM | Admit: 2015-08-23 | Discharge: 2015-08-24 | Disposition: A | Discharge status: Home / Self Care | Payer: BLUE CROSS/BLUE SHIELD | Attending: Emergency Medicine | Admitting: Emergency Medicine

## 2015-08-23 ENCOUNTER — Encounter: Payer: Self-pay | Admitting: Emergency Medicine

## 2015-08-23 DIAGNOSIS — Z7984 Long term (current) use of oral hypoglycemic drugs: Secondary | ICD-10-CM | POA: Insufficient documentation

## 2015-08-23 DIAGNOSIS — I10 Essential (primary) hypertension: Secondary | ICD-10-CM | POA: Insufficient documentation

## 2015-08-23 DIAGNOSIS — E119 Type 2 diabetes mellitus without complications: Secondary | ICD-10-CM | POA: Diagnosis not present

## 2015-08-23 DIAGNOSIS — N200 Calculus of kidney: Secondary | ICD-10-CM

## 2015-08-23 DIAGNOSIS — Z7982 Long term (current) use of aspirin: Secondary | ICD-10-CM | POA: Insufficient documentation

## 2015-08-23 DIAGNOSIS — Z79899 Other long term (current) drug therapy: Secondary | ICD-10-CM | POA: Insufficient documentation

## 2015-08-23 DIAGNOSIS — N132 Hydronephrosis with renal and ureteral calculous obstruction: Secondary | ICD-10-CM | POA: Diagnosis not present

## 2015-08-23 DIAGNOSIS — Z87891 Personal history of nicotine dependence: Secondary | ICD-10-CM | POA: Insufficient documentation

## 2015-08-23 DIAGNOSIS — R109 Unspecified abdominal pain: Secondary | ICD-10-CM | POA: Diagnosis present

## 2015-08-23 LAB — CBC WITH DIFFERENTIAL/PLATELET
BASOS ABS: 0.1 10*3/uL (ref 0–0.1)
BASOS PCT: 1 %
EOS ABS: 0.4 10*3/uL (ref 0–0.7)
Eosinophils Relative: 3 %
HCT: 43.1 % (ref 40.0–52.0)
HEMOGLOBIN: 14.7 g/dL (ref 13.0–18.0)
Lymphocytes Relative: 47 %
Lymphs Abs: 6.2 10*3/uL — ABNORMAL HIGH (ref 1.0–3.6)
MCH: 31.4 pg (ref 26.0–34.0)
MCHC: 34.2 g/dL (ref 32.0–36.0)
MCV: 91.8 fL (ref 80.0–100.0)
Monocytes Absolute: 1.1 10*3/uL — ABNORMAL HIGH (ref 0.2–1.0)
Monocytes Relative: 8 %
NEUTROS PCT: 41 %
Neutro Abs: 5.5 10*3/uL (ref 1.4–6.5)
Platelets: 469 10*3/uL — ABNORMAL HIGH (ref 150–440)
RBC: 4.69 MIL/uL (ref 4.40–5.90)
RDW: 13.1 % (ref 11.5–14.5)
WBC: 13.3 10*3/uL — AB (ref 3.8–10.6)

## 2015-08-23 LAB — BASIC METABOLIC PANEL
ANION GAP: 8 (ref 5–15)
BUN: 16 mg/dL (ref 6–20)
CHLORIDE: 104 mmol/L (ref 101–111)
CO2: 24 mmol/L (ref 22–32)
Calcium: 8.9 mg/dL (ref 8.9–10.3)
Creatinine, Ser: 1.53 mg/dL — ABNORMAL HIGH (ref 0.61–1.24)
GFR calc Af Amer: 57 mL/min — ABNORMAL LOW (ref >60)
GFR calc non Af Amer: 50 mL/min — ABNORMAL LOW (ref >60)
Glucose, Bld: 173 mg/dL — ABNORMAL HIGH (ref 65–99)
POTASSIUM: 4.1 mmol/L (ref 3.5–5.1)
SODIUM: 136 mmol/L (ref 135–145)

## 2015-08-23 MED ORDER — ONDANSETRON HCL 4 MG/2ML IJ SOLN
INTRAMUSCULAR | Status: AC
  Administered 2015-08-23: 4 mg via INTRAVENOUS
  Filled 2015-08-23: qty 2

## 2015-08-23 MED ORDER — SODIUM CHLORIDE 0.9 % IV BOLUS (SEPSIS)
1000.0000 mL | Freq: Once | INTRAVENOUS | Status: AC
  Administered 2015-08-23: 1000 mL via INTRAVENOUS

## 2015-08-23 MED ORDER — ONDANSETRON HCL 4 MG/2ML IJ SOLN
4.0000 mg | Freq: Once | INTRAMUSCULAR | Status: AC
  Administered 2015-08-23: 4 mg via INTRAVENOUS

## 2015-08-23 MED ORDER — KETOROLAC TROMETHAMINE 30 MG/ML IJ SOLN
INTRAMUSCULAR | Status: AC
  Administered 2015-08-23: 30 mg via INTRAVENOUS
  Filled 2015-08-23: qty 1

## 2015-08-23 MED ORDER — KETOROLAC TROMETHAMINE 30 MG/ML IJ SOLN
30.0000 mg | Freq: Once | INTRAMUSCULAR | Status: AC
  Administered 2015-08-23: 30 mg via INTRAVENOUS

## 2015-08-23 NOTE — ED Triage Notes (Signed)
Pt. States hx of kidney stones.  Pt. States lt. Flank pain with dysuria.

## 2015-08-23 NOTE — ED Provider Notes (Signed)
[facility] Emergency Department Provider Note   ____________________________________________   First MD Initiated Contact with Patient 08/23/15 2141     (approximate)  I have reviewed the triage vital signs and the nursing notes.   HISTORY  Chief Complaint Flank Pain (Pt. states lt. flank pain for the past 5 days, worse today.  Dysuria and hx of kidney stones.)   HPI Melvin GARRIDO Sr. is a 55 y.o. male with a history of diabetes as well as hypertension and kidney stones who is presenting to the emergency department today with left flank/lower back pain. He says that he has had pain over the past several days but acutely worsened about 2 hours ago. He says it is now 9 out of 10 and sharp pain. He says that he also has some burning with urination. He says he has had stones in the past which have presented similarly. He said he took hydrocodone at home which did not help with his pain control. Needed a procedure in the past to pass a stone. He has been seen in the past at [facility] urology.  Patient says that he has had difficulty finding a position of comfort and the pain does not worsen with movement.   Past Medical History:  Diagnosis Date  . AK (actinic keratosis)   . Diabetes mellitus without complication ([place])   . History of TIA (transient ischemic attack)   . Hypercholesteremia   . Hyperlipidemia   . Hypertension   . Kidney stones     Patient Active Problem List   Diagnosis Date Noted  . Obesity, Class I, BMI 30-34.9 07/12/2015  . Overweight 04/12/2015  . Left carotid stenosis 04/12/2015  . Tinea corporis 10/03/2014  . Type 2 diabetes mellitus with diabetic neuropathy ([place]) 04/26/2014  . HLD (hyperlipidemia) 04/26/2014  . HTN (hypertension) 04/26/2014  . H/O splenectomy 04/26/2014  . Anterior circulation transient ischemic attack 04/26/2014  . WPW (Wolff-Parkinson-White syndrome) 07/11/2012    Past Surgical History:  Procedure  Laterality Date  . FOOT SURGERY    . HERNIA REPAIR     x2  . SPLENECTOMY      Prior to Admission medications   Medication Sig Start Date End Date Taking? Authorizing Provider  aspirin 81 MG tablet Take 81 mg by mouth daily.   Yes Historical Provider, MD  atorvastatin (LIPITOR) 40 MG tablet Take 1 tablet (40 mg total) by mouth daily. 04/16/15  Yes Adline Potter, MD  glipiZIDE (GLUCOTROL) 10 MG tablet Take 1 tablet (10 mg total) by mouth 2 (two) times daily before a meal. 07/14/15  Yes Adline Potter, MD  lisinopril (PRINIVIL,ZESTRIL) 20 MG tablet Take 1 tablet (20 mg total) by mouth daily. 07/12/15  Yes Adline Potter, MD  metFORMIN (GLUCOPHAGE) 1000 MG tablet Take 1 tablet (1,000 mg total) by mouth 2 (two) times daily. 10/03/14  Yes Adline Potter, MD  blood glucose meter kit and supplies Dispense based on patient and insurance preference. Use up to four times daily as directed. (FOR ICD-9 250.00, 250.01). 04/12/15   Adline Potter, MD    Allergies Review of patient's allergies indicates no known allergies.  Family History  Problem Relation Age of Onset  . Heart disease Mother   . Heart attack Father   . Heart disease Father     Social History Social History  Substance Use Topics  . Smoking status: Former Research scientist (life sciences)  . Smokeless tobacco: Never Used  . Alcohol use No    Review of Systems  Constitutional: No fever/chills Eyes: No visual changes. ENT: No sore throat. Cardiovascular: Denies chest pain. Respiratory: Denies shortness of breath. Gastrointestinal: No abdominal pain.  no vomiting.  No diarrhea.  No constipation. Genitourinary: As above Musculoskeletal: Lower back pain. Skin: Negative for rash. Neurological: Negative for headaches, focal weakness or numbness.  10-point ROS otherwise negative.  ____________________________________________   PHYSICAL EXAM:  VITAL SIGNS: ED Triage Vitals  Enc Vitals Group     BP 08/23/15 2138 (!) 143/89     Pulse Rate 08/23/15 2138 75      Resp 08/23/15 2138 20     Temp 08/23/15 2138 97.6 F (36.4 C)     Temp Source 08/23/15 2138 Oral     SpO2 08/23/15 2138 95 %     Weight 08/23/15 2140 185 lb (83.9 kg)     Height 08/23/15 2140 5' 7" (1.702 m)     Head Circumference --      Peak Flow --      Pain Score 08/23/15 2140 9     Pain Loc --      Pain Edu? --      Excl. in [place]? --     Constitutional: Alert and oriented. Well appearing and in no acute distress. Eyes: Conjunctivae are normal. PERRL. EOMI. Head: Atraumatic. Nose: No congestion/rhinnorhea. Mouth/Throat: Mucous membranes are moist.   Neck: No stridor.   Cardiovascular: Normal rate, regular rhythm. Grossly normal heart sounds.   Respiratory: Normal respiratory effort.  No retractions. Lungs CTAB. Gastrointestinal: Soft and nontender. No distention. Left lower CVA tenderness palpation which is mild. Musculoskeletal: No lower extremity tenderness nor edema.  No joint effusions. Neurologic:  Normal speech and language. No gross focal neurologic deficits are appreciated.  Skin:  Skin is warm, dry and intact. No rash noted. Psychiatric: Mood and affect are normal. Speech and behavior are normal.  ____________________________________________   LABS (all labs ordered are listed, but only abnormal results are displayed)  Labs Reviewed  CBC WITH DIFFERENTIAL/PLATELET - Abnormal; Notable for the following:       Result Value   WBC 13.3 (*)    Platelets 469 (*)    Lymphs Abs 6.2 (*)    Monocytes Absolute 1.1 (*)    All other components within normal limits  BASIC METABOLIC PANEL - Abnormal; Notable for the following:    Glucose, Bld 173 (*)    Creatinine, Ser 1.53 (*)    GFR calc non Af Amer 50 (*)    GFR calc Af Amer 57 (*)    All other components within normal limits  URINALYSIS COMPLETEWITH MICROSCOPIC ([facility] ONLY)   ____________________________________________  EKG   ____________________________________________  RADIOLOGY  Pending ultrasound of  the kidneys at this time. ____________________________________________   PROCEDURES  Procedure(s) performed:   Procedures  Critical Care performed:   ____________________________________________   INITIAL IMPRESSION / ASSESSMENT AND PLAN / ED COURSE  Pertinent labs & imaging results that were available during my care of the patient were reviewed by me and considered in my medical decision making (see chart for details).  ----------------------------------------- 11:18 PM on 08/23/2015 -----------------------------------------  Patient pending her as well as ultrasound of the kidneys at this time. Signed out to Dr. Owens Shark.   Clinical Course     ____________________________________________   FINAL CLINICAL IMPRESSION(S) / ED DIAGNOSES  Left flank pain.    NEW MEDICATIONS STARTED DURING THIS VISIT:  New Prescriptions   No medications on file     Note:  This document was  prepared using Systems analyst and may include unintentional dictation errors.    Orbie Pyo, MD 08/23/15 [phone]

## 2015-08-23 NOTE — ED Notes (Signed)
Pt states unable to give urine sample at this time 

## 2015-08-24 DIAGNOSIS — N132 Hydronephrosis with renal and ureteral calculous obstruction: Secondary | ICD-10-CM | POA: Diagnosis not present

## 2015-08-24 LAB — URINALYSIS COMPLETE WITH MICROSCOPIC (ARMC ONLY)
BILIRUBIN URINE: NEGATIVE
Bacteria, UA: NONE SEEN
Glucose, UA: 50 mg/dL — AB
Ketones, ur: NEGATIVE mg/dL
LEUKOCYTES UA: NEGATIVE
NITRITE: NEGATIVE
PH: 5 (ref 5.0–8.0)
Protein, ur: NEGATIVE mg/dL
SPECIFIC GRAVITY, URINE: 1.019 (ref 1.005–1.030)
SQUAMOUS EPITHELIAL / LPF: NONE SEEN

## 2015-08-24 MED ORDER — KETOROLAC TROMETHAMINE 30 MG/ML IJ SOLN
INTRAMUSCULAR | Status: AC
  Filled 2015-08-24: qty 1

## 2015-08-24 MED ORDER — TAMSULOSIN HCL 0.4 MG PO CAPS: 0.4000 mg | ORAL_CAPSULE | Freq: Every day | ORAL | 0 refills | Status: DC

## 2015-08-24 MED ORDER — KETOROLAC TROMETHAMINE 30 MG/ML IJ SOLN
30.0000 mg | Freq: Once | INTRAMUSCULAR | Status: AC
  Administered 2015-08-24: 30 mg via INTRAVENOUS

## 2015-08-24 MED ORDER — OXYCODONE-ACETAMINOPHEN 5-325 MG PO TABS: 1.0000 | ORAL_TABLET | ORAL | 0 refills | Status: DC | PRN

## 2015-08-24 MED ORDER — ONDANSETRON 4 MG PO TBDP: 4.0000 mg | ORAL_TABLET | Freq: Three times a day (TID) | ORAL | 0 refills | Status: DC | PRN

## 2015-08-27 ENCOUNTER — Emergency Department
Admission: EM | Admit: 2015-08-27 | Discharge: 2015-08-27 | Disposition: A | Discharge status: Home / Self Care | Payer: BLUE CROSS/BLUE SHIELD | Attending: Emergency Medicine | Admitting: Emergency Medicine

## 2015-08-27 ENCOUNTER — Emergency Department: Payer: BLUE CROSS/BLUE SHIELD

## 2015-08-27 ENCOUNTER — Encounter: Payer: Self-pay | Admitting: Emergency Medicine

## 2015-08-27 DIAGNOSIS — R1032 Left lower quadrant pain: Secondary | ICD-10-CM

## 2015-08-27 DIAGNOSIS — I10 Essential (primary) hypertension: Secondary | ICD-10-CM | POA: Diagnosis not present

## 2015-08-27 DIAGNOSIS — Z87891 Personal history of nicotine dependence: Secondary | ICD-10-CM | POA: Diagnosis not present

## 2015-08-27 DIAGNOSIS — Z7984 Long term (current) use of oral hypoglycemic drugs: Secondary | ICD-10-CM | POA: Diagnosis not present

## 2015-08-27 DIAGNOSIS — N200 Calculus of kidney: Secondary | ICD-10-CM | POA: Diagnosis not present

## 2015-08-27 DIAGNOSIS — N132 Hydronephrosis with renal and ureteral calculous obstruction: Secondary | ICD-10-CM | POA: Diagnosis not present

## 2015-08-27 DIAGNOSIS — N202 Calculus of kidney with calculus of ureter: Secondary | ICD-10-CM | POA: Diagnosis not present

## 2015-08-27 DIAGNOSIS — E119 Type 2 diabetes mellitus without complications: Secondary | ICD-10-CM | POA: Insufficient documentation

## 2015-08-27 LAB — URINALYSIS COMPLETE WITH MICROSCOPIC (ARMC ONLY)
BACTERIA UA: NONE SEEN
Bilirubin Urine: NEGATIVE
Glucose, UA: 50 mg/dL — AB
Leukocytes, UA: NEGATIVE
Nitrite: NEGATIVE
PH: 5 (ref 5.0–8.0)
PROTEIN: NEGATIVE mg/dL
SPECIFIC GRAVITY, URINE: 1.019 (ref 1.005–1.030)
Squamous Epithelial / LPF: NONE SEEN

## 2015-08-27 LAB — BASIC METABOLIC PANEL
Anion gap: 9 (ref 5–15)
BUN: 17 mg/dL (ref 6–20)
CHLORIDE: 103 mmol/L (ref 101–111)
CO2: 25 mmol/L (ref 22–32)
CREATININE: 1.47 mg/dL — AB (ref 0.61–1.24)
Calcium: 9.7 mg/dL (ref 8.9–10.3)
GFR calc Af Amer: >60 mL/min (ref >60)
GFR calc non Af Amer: 52 mL/min — ABNORMAL LOW (ref >60)
GLUCOSE: 158 mg/dL — AB (ref 65–99)
Potassium: 4 mmol/L (ref 3.5–5.1)
SODIUM: 137 mmol/L (ref 135–145)

## 2015-08-27 LAB — CBC
HCT: 46.2 % (ref 40.0–52.0)
Hemoglobin: 15.6 g/dL (ref 13.0–18.0)
MCH: 31.1 pg (ref 26.0–34.0)
MCHC: 33.8 g/dL (ref 32.0–36.0)
MCV: 91.9 fL (ref 80.0–100.0)
PLATELETS: 484 10*3/uL — AB (ref 150–440)
RBC: 5.02 MIL/uL (ref 4.40–5.90)
RDW: 13.1 % (ref 11.5–14.5)
WBC: 15.1 10*3/uL — ABNORMAL HIGH (ref 3.8–10.6)

## 2015-08-27 MED ORDER — MORPHINE SULFATE (PF) 4 MG/ML IV SOLN: 4.0000 mg | Freq: Once | INTRAVENOUS | Status: DC

## 2015-08-27 MED ORDER — KETOROLAC TROMETHAMINE 30 MG/ML IJ SOLN
30.0000 mg | Freq: Once | INTRAMUSCULAR | Status: AC
  Administered 2015-08-27: 30 mg via INTRAVENOUS
  Filled 2015-08-27: qty 1

## 2015-08-27 MED ORDER — FENTANYL CITRATE (PF) 100 MCG/2ML IJ SOLN
25.0000 ug | Freq: Once | INTRAMUSCULAR | Status: AC
  Administered 2015-08-27: 25 ug via INTRAVENOUS
  Filled 2015-08-27: qty 2

## 2015-08-27 MED ORDER — SODIUM CHLORIDE 0.9 % IV BOLUS (SEPSIS)
1000.0000 mL | Freq: Once | INTRAVENOUS | Status: AC
  Administered 2015-08-27: 1000 mL via INTRAVENOUS

## 2015-08-27 MED ORDER — FENTANYL CITRATE (PF) 100 MCG/2ML IJ SOLN
50.0000 ug | INTRAMUSCULAR | Status: DC | PRN
  Administered 2015-08-27: 50 ug via INTRAVENOUS

## 2015-08-27 MED ORDER — ONDANSETRON HCL 4 MG/2ML IJ SOLN
INTRAMUSCULAR | Status: AC
  Administered 2015-08-27: 4 mg via INTRAVENOUS
  Filled 2015-08-27: qty 2

## 2015-08-27 MED ORDER — FENTANYL CITRATE (PF) 100 MCG/2ML IJ SOLN
INTRAMUSCULAR | Status: AC
  Administered 2015-08-27: 50 ug via INTRAVENOUS
  Filled 2015-08-27: qty 2

## 2015-08-27 MED ORDER — ONDANSETRON HCL 4 MG/2ML IJ SOLN
4.0000 mg | Freq: Once | INTRAMUSCULAR | Status: AC
  Administered 2015-08-27: 4 mg via INTRAVENOUS

## 2015-08-27 MED ORDER — MORPHINE SULFATE (PF) 2 MG/ML IV SOLN
INTRAVENOUS | Status: AC
  Administered 2015-08-27: 4 mg via INTRAVENOUS
  Filled 2015-08-27: qty 2

## 2015-08-27 NOTE — ED Triage Notes (Signed)
Pt states he was seen here on Friday with c/o left flank pain and was dx with 24mm kidney stone.. States he cant get an appointment until sept 8th

## 2015-08-27 NOTE — ED Triage Notes (Signed)
Pt with known 106mm kidney stone and began having unbearable pain this morning.  PT appearing in triage bent over with head in hand.

## 2015-08-27 NOTE — ED Provider Notes (Signed)
[facility] Emergency Department Provider Note   ____________________________________________   First MD Initiated Contact with Patient 08/27/15 1413     (approximate)  I have reviewed the triage vital signs and the nursing notes.   HISTORY  Chief Complaint Flank Pain    HPI Melvin PASSOW Sr. is a 55 y.o. male with a history of kidney stones who is presenting again to the emergency department now with left lower quadrant abdominal pain. He was here on August 11 and I evaluated him initially. He was having left flank pain that was higher up and more towards his back at that time. He had nausea which showed a 4 mm renal stone as well as mild left-sided hydronephrosis. He also blood in his urine. His pain was controlled that time and he was discharged to home with Percocet as well as Flomax. He has been taking the Percocet as needed at home but the pain started back more intensely this morning at 3 AM. He says it is an 8 out of 10 right now despite having morphine and fentanyl in triage. He has had nausea but it is now relieved after Zofran. Does not report any fevers at home.Says the pain is sharp and now in his left lower quadrant.   Past Medical History:  Diagnosis Date  . AK (actinic keratosis)   . Diabetes mellitus without complication ([place])   . History of TIA (transient ischemic attack)   . Hypercholesteremia   . Hyperlipidemia   . Hypertension   . Kidney stones     Patient Active Problem List   Diagnosis Date Noted  . Obesity, Class I, BMI 30-34.9 07/12/2015  . Overweight 04/12/2015  . Left carotid stenosis 04/12/2015  . Tinea corporis 10/03/2014  . Type 2 diabetes mellitus with diabetic neuropathy ([place]) 04/26/2014  . HLD (hyperlipidemia) 04/26/2014  . HTN (hypertension) 04/26/2014  . H/O splenectomy 04/26/2014  . Anterior circulation transient ischemic attack 04/26/2014  . WPW (Wolff-Parkinson-White syndrome) 07/11/2012    Past  Surgical History:  Procedure Laterality Date  . FOOT SURGERY    . HERNIA REPAIR     x2  . SPLENECTOMY      Prior to Admission medications   Medication Sig Start Date End Date Taking? Authorizing Provider  aspirin 81 MG tablet Take 81 mg by mouth daily.    Historical Provider, MD  atorvastatin (LIPITOR) 40 MG tablet Take 1 tablet (40 mg total) by mouth daily. 04/16/15   Adline Potter, MD  blood glucose meter kit and supplies Dispense based on patient and insurance preference. Use up to four times daily as directed. (FOR ICD-9 250.00, 250.01). 04/12/15   Adline Potter, MD  glipiZIDE (GLUCOTROL) 10 MG tablet Take 1 tablet (10 mg total) by mouth 2 (two) times daily before a meal. 07/14/15   Adline Potter, MD  lisinopril (PRINIVIL,ZESTRIL) 20 MG tablet Take 1 tablet (20 mg total) by mouth daily. 07/12/15   Adline Potter, MD  metFORMIN (GLUCOPHAGE) 1000 MG tablet Take 1 tablet (1,000 mg total) by mouth 2 (two) times daily. 10/03/14   Adline Potter, MD  ondansetron (ZOFRAN ODT) 4 MG disintegrating tablet Take 1 tablet (4 mg total) by mouth every 8 (eight) hours as needed for nausea or vomiting. 08/24/15   Gregor Hams, MD  oxyCODONE-acetaminophen (ROXICET) 5-325 MG tablet Take 1 tablet by mouth every 4 (four) hours as needed for severe pain. 08/24/15   Gregor Hams, MD  tamsulosin (FLOMAX) 0.4 MG CAPS capsule  Take 1 capsule (0.4 mg total) by mouth daily after breakfast. 08/24/15   Gregor Hams, MD    Allergies Review of patient's allergies indicates no known allergies.  Family History  Problem Relation Age of Onset  . Heart disease Mother   . Heart attack Father   . Heart disease Father     Social History Social History  Substance Use Topics  . Smoking status: Former Research scientist (life sciences)  . Smokeless tobacco: Never Used  . Alcohol use No    Review of Systems Constitutional: No fever/chills Eyes: No visual changes. ENT: No sore throat. Cardiovascular: Denies chest pain. Respiratory: Denies  shortness of breath. Gastrointestinal: no vomiting.  No diarrhea.  No constipation. Genitourinary: Negative for dysuria. Musculoskeletal: Negative for back pain. Skin: Negative for rash. Neurological: Negative for headaches, focal weakness or numbness.  10-point ROS otherwise negative.  ____________________________________________   PHYSICAL EXAM:  VITAL SIGNS: ED Triage Vitals [08/27/15 1135]  Enc Vitals Group     BP 136/83     Pulse      Resp 18     Temp 97.5 F (36.4 C)     Temp Source Oral     SpO2 98 %     Weight 185 lb (83.9 kg)     Height _0  (1.702 m)     Head Circumference      Peak Flow      Pain Score 10     Pain Loc      Pain Edu?      Excl. in [place]?     Constitutional: Alert and oriented. Appears uncomfortable Eyes: Conjunctivae are normal. PERRL. EOMI. Head: Atraumatic. Nose: No congestion/rhinnorhea. Mouth/Throat: Mucous membranes are moist.   Neck: No stridor.   Cardiovascular: Normal rate, regular rhythm. Grossly normal heart sounds.  Good peripheral circulation. Respiratory: Normal respiratory effort.  No retractions. Lungs CTAB. Gastrointestinal: Soft and mild to moderate left lower quadrant abdominal pain without rebound or guarding. No distention.  No CVA tenderness. Musculoskeletal: No lower extremity tenderness nor edema.  No joint effusions. Neurologic:  Normal speech and language. No gross focal neurologic deficits are appreciated.  Skin:  Skin is warm, dry and intact. No rash noted. Psychiatric: Mood and affect are normal. Speech and behavior are normal.  ____________________________________________   LABS (all labs ordered are listed, but only abnormal results are displayed)  Labs Reviewed  URINALYSIS COMPLETEWITH MICROSCOPIC ([place]) - Abnormal; Notable for the following:       Result Value   Color, Urine YELLOW (*)    APPearance CLEAR (*)    Glucose, UA 50 (*)    Ketones, ur 1+ (*)    Hgb urine dipstick 1+ (*)    All other  components within normal limits  BASIC METABOLIC PANEL - Abnormal; Notable for the following:    Glucose, Bld 158 (*)    Creatinine, Ser 1.47 (*)    GFR calc non Af Amer 52 (*)    All other components within normal limits  CBC - Abnormal; Notable for the following:    WBC 15.1 (*)    Platelets 484 (*)    All other components within normal limits   ____________________________________________  EKG   ____________________________________________  RADIOLOGY  CT RENAL STONE STUDY (Accession [id number]) (Order [id number])  Imaging  Date: 08/27/2015 Department: [facility] EMERGENCY DEPARTMENT Released By/Authorizing: Orbie Pyo, MD (auto-released)  PACS Images   Show images for CT RENAL STONE STUDY  Study Result   CLINICAL  DATA:  Left lower quadrant pain. Recently diagnosed with a left-sided kidney stone.  EXAM: CT ABDOMEN AND PELVIS WITHOUT CONTRAST  TECHNIQUE: Multidetector CT imaging of the abdomen and pelvis was performed following the standard protocol without IV contrast.  COMPARISON:  Renal ultrasound 08/23/2015. CT abdomen and pelvis 04/12/2011.  FINDINGS: The visualized lung bases are clear.  The liver, gallbladder, adrenal glands, and pancreas have an unremarkable enhanced appearance. Sequelae of prior splenectomy are again identified within unchanged 3.3 cm splenule in the left upper quadrant. There are 5 small nonobstructing calculi in the right kidney measuring up to 2 mm in size. There is a punctate 1 mm nonobstructing calculus in the interpolar left kidney. A 3 mm calculus is present in the distal left ureter just proximal to the UVJ with resultant mild hydroureteronephrosis. There is mild left perinephric stranding.  There is no bowel dilatation. An 8 mm soft tissue nodule adjacent to the sigmoid colon is smaller than on the prior CT and benign in appearance. The appendix is unremarkable.  The bladder and  prostate are unremarkable. Moderate abdominal aortic atherosclerosis is noted without aneurysm. No free fluid or enlarged lymph nodes are identified. A small T11 superior endplate Schmorl's node is noted.  IMPRESSION: 1. 3 mm obstructing distal left ureteral stone with mild hydroureteronephrosis. 2. Nonobstructing bilateral nephrolithiasis.   Electronically Signed   By: Logan Bores M.D.   On: 08/27/2015 15:09     ____________________________________________   PROCEDURES  Procedure(s) performed:   Procedures  Critical Care performed:   ____________________________________________   INITIAL IMPRESSION / ASSESSMENT AND PLAN / ED COURSE  Pertinent labs & imaging results that were available during my care of the patient were reviewed by me and considered in my medical decision making (see chart for details).  ----------------------------------------- 5:08 PM on 08/27/2015 -----------------------------------------  Patient now completely relieved of his pain. Does have a slightly elevated white blood cell count today but without any fever and the patient appears comfortable and his pain is controlled. Furthermore, it appears that the stone is about to enter his bladder and his pain is likely secondary to the stone being at the UVJ. The patient says he has plenty of Percocet and Flomax left for home use. He will continue outpatient treatment at this time and follow-up at his scheduled appointment on September 8 at [facility] urology. I gave the patient strict return precautions and he knows to return immediately to the emergency department for any worsening or concerning symptoms especially fever, worsening abdominal pain, nausea or vomiting. He is understanding of this plan and willing to comply.  Clinical Course     ____________________________________________   FINAL CLINICAL IMPRESSION(S) / ED DIAGNOSES  Final diagnoses:  Left lower quadrant pain    Ureterolithiasis.   NEW MEDICATIONS STARTED DURING THIS VISIT:  New Prescriptions   No medications on file     Note:  This document was prepared using Dragon voice recognition software and may include unintentional dictation errors.    Orbie Pyo, MD 08/27/15 1710

## 2015-08-30 ENCOUNTER — Other Ambulatory Visit: Payer: Self-pay

## 2015-09-02 ENCOUNTER — Ambulatory Visit: Payer: BLUE CROSS/BLUE SHIELD | Admitting: Urology

## 2015-09-02 ENCOUNTER — Encounter: Payer: Self-pay | Admitting: Urology

## 2015-09-20 ENCOUNTER — Ambulatory Visit: Payer: BLUE CROSS/BLUE SHIELD

## 2015-10-14 ENCOUNTER — Other Ambulatory Visit: Payer: Self-pay

## 2015-10-21 ENCOUNTER — Ambulatory Visit (INDEPENDENT_AMBULATORY_CARE_PROVIDER_SITE_OTHER): Payer: BLUE CROSS/BLUE SHIELD | Admitting: Family Medicine

## 2015-10-21 ENCOUNTER — Encounter: Payer: Self-pay | Admitting: Family Medicine

## 2015-10-21 VITALS — BP 120/80 | HR 72 | Ht 67.0 in | Wt 193.0 lb

## 2015-10-21 DIAGNOSIS — Z23 Encounter for immunization: Secondary | ICD-10-CM

## 2015-10-21 DIAGNOSIS — I10 Essential (primary) hypertension: Secondary | ICD-10-CM

## 2015-10-21 DIAGNOSIS — E1140 Type 2 diabetes mellitus with diabetic neuropathy, unspecified: Secondary | ICD-10-CM

## 2015-10-21 DIAGNOSIS — E782 Mixed hyperlipidemia: Secondary | ICD-10-CM | POA: Diagnosis not present

## 2015-10-21 MED ORDER — GLIPIZIDE 10 MG PO TABS: 10.0000 mg | ORAL_TABLET | Freq: Two times a day (BID) | ORAL | 1 refills | Status: DC

## 2015-10-21 MED ORDER — LISINOPRIL 20 MG PO TABS: 20.0000 mg | ORAL_TABLET | Freq: Every day | ORAL | 1 refills | Status: DC

## 2015-10-21 MED ORDER — ATORVASTATIN CALCIUM 40 MG PO TABS: 40.0000 mg | ORAL_TABLET | Freq: Every day | ORAL | 1 refills | Status: DC

## 2015-10-21 MED ORDER — METFORMIN HCL 1000 MG PO TABS: 1000.0000 mg | ORAL_TABLET | Freq: Two times a day (BID) | ORAL | 1 refills | Status: DC

## 2015-10-21 NOTE — Progress Notes (Signed)
Name: Melvin GIUSTO Sr.   MRN: [id number]    DOB: 01-14-1960   Date:10/21/2015       Progress Note  Subjective  Chief Complaint  Chief Complaint  Patient presents with  . Hypertension  . Hyperlipidemia  . Diabetes    Hypertension  This is a chronic problem. The current episode started more than 1 year ago. The problem has been gradually improving since onset. The problem is controlled. Pertinent negatives include no anxiety, blurred vision, chest pain, headaches, malaise/fatigue, neck pain, orthopnea, palpitations, peripheral edema, PND, shortness of breath or sweats. There are no associated agents to hypertension. There are no known risk factors for coronary artery disease. Past treatments include ACE inhibitors. There are no compliance problems.  There is no history of angina, kidney disease, CAD/MI, CVA, heart failure, left ventricular hypertrophy, PVD, renovascular disease or retinopathy. There is no history of chronic renal disease or a hypertension causing med.  Hyperlipidemia  This is a chronic problem. The current episode started more than 1 year ago. The problem is controlled. Recent lipid tests were reviewed and are normal. He has no history of chronic renal disease, diabetes, hypothyroidism, liver disease or obesity. Factors aggravating his hyperlipidemia include thiazides. Pertinent negatives include no chest pain, focal sensory loss, focal weakness, leg pain, myalgias or shortness of breath. Current antihyperlipidemic treatment includes statins. The current treatment provides mild improvement of lipids. There are no compliance problems.  Risk factors for coronary artery disease include stress, hypertension and dyslipidemia.  Diabetes  He presents for his follow-up diabetic visit. He has type 2 diabetes mellitus. His disease course has been stable. There are no hypoglycemic associated symptoms. Pertinent negatives for hypoglycemia include no confusion, dizziness, headaches, hunger,  nervousness/anxiousness or sweats. Pertinent negatives for diabetes include no blurred vision, no chest pain, no fatigue, no foot paresthesias, no foot ulcerations, no polydipsia, no polyphagia, no polyuria, no visual change, no weakness and no weight loss. There are no hypoglycemic complications. Symptoms are stable. Diabetic complications include nephropathy. Pertinent negatives for diabetic complications include no CVA, PVD or retinopathy. There are no known risk factors for coronary artery disease. Current diabetic treatment includes oral agent (dual therapy). He is compliant with treatment all of the time. He is following a generally healthy diet. Meal planning includes avoidance of concentrated sweets. He participates in exercise intermittently. His breakfast blood glucose is taken between 8-9 am. His breakfast blood glucose range is generally 130-140 mg/dl.    No problem-specific Assessment & Plan notes found for this encounter.   Past Medical History:  Diagnosis Date  . AK (actinic keratosis)   . Diabetes mellitus without complication ([place])   . History of TIA (transient ischemic attack)   . Hypercholesteremia   . Hyperlipidemia   . Hypertension   . Kidney stones     Past Surgical History:  Procedure Laterality Date  . FOOT SURGERY    . HERNIA REPAIR     x2  . SPLENECTOMY      Family History  Problem Relation Age of Onset  . Heart disease Mother   . Heart attack Father   . Heart disease Father     Social History   Social History  . Marital status: Married    Spouse name: N/A  . Number of children: N/A  . Years of education: N/A   Occupational History  . Not on file.   Social History Main Topics  . Smoking status: Former Research scientist (life sciences)  . Smokeless tobacco: Never Used  .  Alcohol use No  . Drug use: No  . Sexual activity: Not on file   Other Topics Concern  . Not on file   Social History Narrative  . No narrative on file    No Known Allergies   Review of  Systems  Constitutional: Negative for chills, fatigue, fever, malaise/fatigue and weight loss.  HENT: Negative for ear discharge, ear pain and sore throat.   Eyes: Negative for blurred vision.  Respiratory: Negative for cough, sputum production, shortness of breath and wheezing.   Cardiovascular: Negative for chest pain, palpitations, orthopnea, leg swelling and PND.  Gastrointestinal: Negative for abdominal pain, blood in stool, constipation, diarrhea, heartburn, melena and nausea.  Genitourinary: Negative for dysuria, frequency, hematuria and urgency.  Musculoskeletal: Negative for back pain, joint pain, myalgias and neck pain.  Skin: Negative for rash.  Neurological: Negative for dizziness, tingling, sensory change, focal weakness, weakness and headaches.  Endo/Heme/Allergies: Negative for environmental allergies, polydipsia and polyphagia. Does not bruise/bleed easily.  Psychiatric/Behavioral: Negative for confusion, depression and suicidal ideas. The patient is not nervous/anxious and does not have insomnia.      Objective  Vitals:   10/21/15 1001  BP: 120/80  Pulse: 72  Weight: 193 lb (87.5 kg)  Height: 5\' 7"  (1.702 m)    Physical Exam  Constitutional: He is oriented to person, place, and time and well-developed, well-nourished, and in no distress.  HENT:  Head: Normocephalic.  Right Ear: External ear normal.  Left Ear: External ear normal.  Nose: Nose normal.  Mouth/Throat: Oropharynx is clear and moist.  Eyes: Conjunctivae and EOM are normal. Pupils are equal, round, and reactive to light. Right eye exhibits no discharge. Left eye exhibits no discharge. No scleral icterus.  Neck: Normal range of motion. Neck supple. No JVD present. No tracheal deviation present. No thyromegaly present.  Cardiovascular: Normal rate, regular rhythm, normal heart sounds and intact distal pulses.  Exam reveals no gallop and no friction rub.   No murmur heard. Pulmonary/Chest: Breath sounds  normal. No respiratory distress. He has no wheezes. He has no rales.  Abdominal: Soft. Bowel sounds are normal. He exhibits no mass. There is no hepatosplenomegaly. There is no tenderness. There is no rebound, no guarding and no CVA tenderness.  Musculoskeletal: Normal range of motion. He exhibits no edema or tenderness.  Lymphadenopathy:    He has no cervical adenopathy.  Neurological: He is alert and oriented to person, place, and time. He has normal sensation, normal strength, normal reflexes and intact cranial nerves. No cranial nerve deficit.  Skin: Skin is warm. No rash noted.  Psychiatric: Mood and affect normal.  Nursing note and vitals reviewed.     Assessment & Plan  Problem List Items Addressed This Visit      Cardiovascular and Mediastinum   HTN (hypertension)   Relevant Medications   atorvastatin (LIPITOR) 40 MG tablet   lisinopril (PRINIVIL,ZESTRIL) 20 MG tablet   Other Relevant Orders   Renal Function Panel     Endocrine   Type 2 diabetes mellitus with diabetic neuropathy (HCC) - Primary   Relevant Medications   atorvastatin (LIPITOR) 40 MG tablet   glipiZIDE (GLUCOTROL) 10 MG tablet   lisinopril (PRINIVIL,ZESTRIL) 20 MG tablet   metFORMIN (GLUCOPHAGE) 1000 MG tablet   Other Relevant Orders   Hemoglobin A1c   Renal Function Panel     Other   HLD (hyperlipidemia)   Relevant Medications   atorvastatin (LIPITOR) 40 MG tablet   lisinopril (PRINIVIL,ZESTRIL) 20 MG tablet  Other Relevant Orders   Lipid Profile    Other Visit Diagnoses    Immunization due       Relevant Orders   Flu Vaccine QUAD 36+ mos PF IM (Fluarix & Fluzone Quad PF) (Completed)        Dr. Chapman Matteucci [facility]  10/21/15

## 2015-10-21 NOTE — Patient Instructions (Signed)

## 2015-10-22 LAB — HEMOGLOBIN A1C
Est. average glucose Bld gHb Est-mCnc: 183 mg/dL
Hgb A1c MFr Bld: 8 % — ABNORMAL HIGH (ref 4.8–5.6)

## 2015-10-22 LAB — RENAL FUNCTION PANEL
ALBUMIN: 4.4 g/dL (ref 3.5–5.5)
BUN / CREAT RATIO: 10 (ref 9–20)
BUN: 13 mg/dL (ref 6–24)
CALCIUM: 10.1 mg/dL (ref 8.7–10.2)
CHLORIDE: 96 mmol/L (ref 96–106)
CO2: 23 mmol/L (ref 18–29)
Creatinine, Ser: 1.27 mg/dL (ref 0.76–1.27)
GFR calc Af Amer: 73 mL/min/{1.73_m2} (ref >59)
GFR calc non Af Amer: 63 mL/min/{1.73_m2} (ref >59)
GLUCOSE: 187 mg/dL — AB (ref 65–99)
POTASSIUM: 4.9 mmol/L (ref 3.5–5.2)
Phosphorus: 3.2 mg/dL (ref 2.5–4.5)
Sodium: 136 mmol/L (ref 134–144)

## 2015-10-22 LAB — LIPID PANEL
CHOLESTEROL TOTAL: 171 mg/dL (ref 100–199)
Chol/HDL Ratio: 3.7 ratio units (ref 0.0–5.0)
HDL: 46 mg/dL (ref >39)
LDL Calculated: 99 mg/dL (ref 0–99)
Triglycerides: 129 mg/dL (ref 0–149)
VLDL CHOLESTEROL CAL: 26 mg/dL (ref 5–40)

## 2016-04-03 ENCOUNTER — Ambulatory Visit (INDEPENDENT_AMBULATORY_CARE_PROVIDER_SITE_OTHER): Payer: BLUE CROSS/BLUE SHIELD | Admitting: Family Medicine

## 2016-04-03 ENCOUNTER — Encounter: Payer: Self-pay | Admitting: Family Medicine

## 2016-04-03 VITALS — BP 140/70 | HR 80 | Ht 67.0 in | Wt 183.0 lb

## 2016-04-03 DIAGNOSIS — I10 Essential (primary) hypertension: Secondary | ICD-10-CM | POA: Diagnosis not present

## 2016-04-03 DIAGNOSIS — E782 Mixed hyperlipidemia: Secondary | ICD-10-CM | POA: Diagnosis not present

## 2016-04-03 DIAGNOSIS — Z23 Encounter for immunization: Secondary | ICD-10-CM | POA: Diagnosis not present

## 2016-04-03 DIAGNOSIS — N400 Enlarged prostate without lower urinary tract symptoms: Secondary | ICD-10-CM | POA: Diagnosis not present

## 2016-04-03 DIAGNOSIS — E1140 Type 2 diabetes mellitus with diabetic neuropathy, unspecified: Secondary | ICD-10-CM

## 2016-04-03 MED ORDER — GLIPIZIDE 10 MG PO TABS: 10.0000 mg | ORAL_TABLET | Freq: Two times a day (BID) | ORAL | 1 refills | Status: DC

## 2016-04-03 MED ORDER — ATORVASTATIN CALCIUM 40 MG PO TABS: 40.0000 mg | ORAL_TABLET | Freq: Every day | ORAL | 1 refills | Status: DC

## 2016-04-03 MED ORDER — TAMSULOSIN HCL 0.4 MG PO CAPS: 0.4000 mg | ORAL_CAPSULE | Freq: Every day | ORAL | 1 refills | Status: DC

## 2016-04-03 MED ORDER — LISINOPRIL 20 MG PO TABS: 20.0000 mg | ORAL_TABLET | Freq: Every day | ORAL | 1 refills | Status: DC

## 2016-04-03 MED ORDER — METFORMIN HCL 1000 MG PO TABS: 1000.0000 mg | ORAL_TABLET | Freq: Two times a day (BID) | ORAL | 1 refills | Status: DC

## 2016-04-03 NOTE — Progress Notes (Signed)
Name: Melvin NILA Sr.   MRN: [id number]    DOB: 05/26/1960   Date:04/03/2016       Progress Note  Subjective  Chief Complaint  Chief Complaint  Patient presents with  . Hypertension  . Hyperlipidemia  . Diabetes    Patient needs refill on BPH medication.No hesitancy/decrease stream, nor nocturia.     Diabetes  He presents for his follow-up diabetic visit. He has type 2 diabetes mellitus. His disease course has been stable. Pertinent negatives for hypoglycemia include no confusion, dizziness, headaches, hunger, mood changes, nervousness/anxiousness, pallor, seizures, sleepiness, speech difficulty, sweats or tremors. Pertinent negatives for diabetes include no blurred vision, no chest pain, no fatigue, no foot paresthesias, no foot ulcerations, no polydipsia, no polyphagia, no polyuria, no visual change, no weakness and no weight loss. There are no hypoglycemic complications. Symptoms are stable. Pertinent negatives for diabetic complications include no CVA, PVD or retinopathy. There are no known risk factors for coronary artery disease. He is compliant with treatment all of the time. He is following a generally healthy diet. He participates in exercise daily. There is no change in his home blood glucose trend. His breakfast blood glucose is taken between 8-9 am. His breakfast blood glucose range is generally 180-200 mg/dl. An ACE inhibitor/angiotensin II receptor blocker is being taken. He does not see a podiatrist.Eye exam is not current.  Hyperlipidemia  This is a chronic problem. The problem is controlled. Recent lipid tests were reviewed and are normal. He has no history of chronic renal disease, diabetes, hypothyroidism, liver disease, obesity or nephrotic syndrome. Associated symptoms include leg pain. Pertinent negatives include no chest pain, focal sensory loss, focal weakness, myalgias or shortness of breath. Current antihyperlipidemic treatment includes statins. The current treatment  provides mild improvement of lipids. There are no compliance problems.  Risk factors for coronary artery disease include dyslipidemia, hypertension and male sex.  Hypertension  This is a chronic problem. The current episode started more than 1 year ago. The problem has been gradually improving since onset. Pertinent negatives include no anxiety, blurred vision, chest pain, headaches, malaise/fatigue, neck pain, orthopnea, palpitations, peripheral edema, PND, shortness of breath or sweats. There are no associated agents to hypertension. Risk factors for coronary artery disease include diabetes mellitus and dyslipidemia. Past treatments include ACE inhibitors. There is no history of angina, kidney disease, CAD/MI, CVA, heart failure, left ventricular hypertrophy, PVD or retinopathy. There is no history of chronic renal disease.    No problem-specific Assessment & Plan notes found for this encounter.   Past Medical History:  Diagnosis Date  . AK (actinic keratosis)   . Diabetes mellitus without complication ([place])   . History of TIA (transient ischemic attack)   . Hypercholesteremia   . Hyperlipidemia   . Hypertension   . Kidney stones     Past Surgical History:  Procedure Laterality Date  . FOOT SURGERY    . HERNIA REPAIR     x2  . SPLENECTOMY      Family History  Problem Relation Age of Onset  . Heart disease Mother   . Heart attack Father   . Heart disease Father     Social History   Social History  . Marital status: Married    Spouse name: N/A  . Number of children: N/A  . Years of education: N/A   Occupational History  . Not on file.   Social History Main Topics  . Smoking status: Former Research scientist (life sciences)  . Smokeless tobacco: Never  Used  . Alcohol use No  . Drug use: No  . Sexual activity: Not on file   Other Topics Concern  . Not on file   Social History Narrative  . No narrative on file    No Known Allergies  Outpatient Medications Prior to Visit  Medication Sig  Dispense Refill  . aspirin 81 MG tablet Take 81 mg by mouth daily.    . blood glucose meter kit and supplies Dispense based on patient and insurance preference. Use up to four times daily as directed. (FOR ICD-9 250.00, 250.01). 1 each 0  . atorvastatin (LIPITOR) 40 MG tablet Take 1 tablet (40 mg total) by mouth daily. 90 tablet 1  . glipiZIDE (GLUCOTROL) 10 MG tablet Take 1 tablet (10 mg total) by mouth 2 (two) times daily before a meal. 90 tablet 1  . lisinopril (PRINIVIL,ZESTRIL) 20 MG tablet Take 1 tablet (20 mg total) by mouth daily. 90 tablet 1  . metFORMIN (GLUCOPHAGE) 1000 MG tablet Take 1 tablet (1,000 mg total) by mouth 2 (two) times daily. 180 tablet 1  . tamsulosin (FLOMAX) 0.4 MG CAPS capsule Take 1 capsule (0.4 mg total) by mouth daily after breakfast. (Patient not taking: Reported on 10/21/2015) 14 capsule 0   No facility-administered medications prior to visit.     Review of Systems  Constitutional: Negative for chills, fatigue, fever, malaise/fatigue and weight loss.  HENT: Negative for ear discharge, ear pain and sore throat.   Eyes: Negative for blurred vision.  Respiratory: Negative for cough, sputum production, shortness of breath and wheezing.   Cardiovascular: Negative for chest pain, palpitations, orthopnea, leg swelling and PND.  Gastrointestinal: Negative for abdominal pain, blood in stool, constipation, diarrhea, heartburn, melena and nausea.  Genitourinary: Negative for dysuria, frequency, hematuria and urgency.  Musculoskeletal: Negative for back pain, joint pain, myalgias and neck pain.  Skin: Negative for pallor and rash.  Neurological: Negative for dizziness, tingling, tremors, sensory change, focal weakness, seizures, speech difficulty, weakness and headaches.  Endo/Heme/Allergies: Negative for environmental allergies, polydipsia and polyphagia. Does not bruise/bleed easily.  Psychiatric/Behavioral: Negative for confusion, depression and suicidal ideas. The  patient is not nervous/anxious and does not have insomnia.      Objective  Vitals:   04/03/16 1507  BP: 140/70  Pulse: 80  Weight: 183 lb (83 kg)  Height: 5' 7" (1.702 m)    Physical Exam  Constitutional: He is oriented to person, place, and time and well-developed, well-nourished, and in no distress.  HENT:  Head: Normocephalic.  Right Ear: External ear normal. Tympanic membrane is retracted.  Left Ear: External ear normal. Tympanic membrane is retracted.  Nose: Nose normal.  Mouth/Throat: Oropharynx is clear and moist.  Eyes: Conjunctivae and EOM are normal. Pupils are equal, round, and reactive to light. Right eye exhibits no discharge. Left eye exhibits no discharge. No scleral icterus.  Fundoscopic exam:      The right eye shows no arteriolar narrowing and no hemorrhage.       The left eye shows no arteriolar narrowing, no AV nicking and no hemorrhage.  Neck: Normal range of motion. Neck supple. No JVD present. No tracheal deviation present. No thyromegaly present.  Cardiovascular: Normal rate, regular rhythm, S1 normal, S2 normal, normal heart sounds and intact distal pulses.  Exam reveals no gallop, no S3, no S4 and no friction rub.   No murmur heard. Pulmonary/Chest: Breath sounds normal. No respiratory distress. He has no wheezes. He has no rales.  Abdominal: Soft.  Bowel sounds are normal. He exhibits no mass. There is no hepatosplenomegaly. There is no tenderness. There is no rebound, no guarding and no CVA tenderness.  Musculoskeletal: Normal range of motion. He exhibits no edema or tenderness.  Lymphadenopathy:    He has no cervical adenopathy.  Neurological: He is alert and oriented to person, place, and time. He has normal sensation, normal strength, normal reflexes and intact cranial nerves. No cranial nerve deficit.  Monofilament normal  Skin: Skin is warm and intact. No rash noted.  Psychiatric: Mood and affect normal.  Nursing note and vitals  reviewed.     Assessment & Plan  Problem List Items Addressed This Visit      Cardiovascular and Mediastinum   HTN (hypertension)   Relevant Medications   atorvastatin (LIPITOR) 40 MG tablet   lisinopril (PRINIVIL,ZESTRIL) 20 MG tablet   Other Relevant Orders   Renal Function Panel     Endocrine   Type 2 diabetes mellitus with diabetic neuropathy (HCC)   Relevant Medications   glipiZIDE (GLUCOTROL) 10 MG tablet   atorvastatin (LIPITOR) 40 MG tablet   metFORMIN (GLUCOPHAGE) 1000 MG tablet   lisinopril (PRINIVIL,ZESTRIL) 20 MG tablet   Other Relevant Orders   Hemoglobin A1c   Renal Function Panel   Lipid Profile     Other   HLD (hyperlipidemia)   Relevant Medications   atorvastatin (LIPITOR) 40 MG tablet   lisinopril (PRINIVIL,ZESTRIL) 20 MG tablet   Other Relevant Orders   Lipid Profile    Other Visit Diagnoses    Benign prostatic hyperplasia without lower urinary tract symptoms    -  Primary   Relevant Medications   tamsulosin (FLOMAX) 0.4 MG CAPS capsule   Other Relevant Orders   PSA   Need for Tdap vaccination       Relevant Orders   Tdap vaccine greater than or equal to 7yo IM (Completed)      Meds ordered this encounter  Medications  . tamsulosin (FLOMAX) 0.4 MG CAPS capsule    Sig: Take 1 capsule (0.4 mg total) by mouth daily after breakfast.    Dispense:  90 capsule    Refill:  1  . glipiZIDE (GLUCOTROL) 10 MG tablet    Sig: Take 1 tablet (10 mg total) by mouth 2 (two) times daily before a meal.    Dispense:  90 tablet    Refill:  1  . atorvastatin (LIPITOR) 40 MG tablet    Sig: Take 1 tablet (40 mg total) by mouth daily.    Dispense:  90 tablet    Refill:  1  . metFORMIN (GLUCOPHAGE) 1000 MG tablet    Sig: Take 1 tablet (1,000 mg total) by mouth 2 (two) times daily.    Dispense:  180 tablet    Refill:  1  . lisinopril (PRINIVIL,ZESTRIL) 20 MG tablet    Sig: Take 1 tablet (20 mg total) by mouth daily.    Dispense:  90 tablet    Refill:  1       Dr. Otilio Miu [facility]  04/03/16

## 2016-04-04 LAB — RENAL FUNCTION PANEL
Albumin: 4.2 g/dL (ref 3.5–5.5)
BUN / CREAT RATIO: 13 (ref 9–20)
BUN: 16 mg/dL (ref 6–24)
CALCIUM: 10.8 mg/dL — AB (ref 8.7–10.2)
CHLORIDE: 99 mmol/L (ref 96–106)
CO2: 27 mmol/L (ref 18–29)
Creatinine, Ser: 1.22 mg/dL (ref 0.76–1.27)
GFR calc Af Amer: 76 mL/min/{1.73_m2} (ref >59)
GFR calc non Af Amer: 66 mL/min/{1.73_m2} (ref >59)
Glucose: 115 mg/dL — ABNORMAL HIGH (ref 65–99)
PHOSPHORUS: 4 mg/dL (ref 2.5–4.5)
Potassium: 4.6 mmol/L (ref 3.5–5.2)
SODIUM: 141 mmol/L (ref 134–144)

## 2016-04-04 LAB — LIPID PANEL
Chol/HDL Ratio: 3.7 ratio units (ref 0.0–5.0)
Cholesterol, Total: 184 mg/dL (ref 100–199)
HDL: 50 mg/dL (ref >39)
LDL Calculated: 101 mg/dL — ABNORMAL HIGH (ref 0–99)
TRIGLYCERIDES: 167 mg/dL — AB (ref 0–149)
VLDL Cholesterol Cal: 33 mg/dL (ref 5–40)

## 2016-04-04 LAB — HEMOGLOBIN A1C
ESTIMATED AVERAGE GLUCOSE: 192 mg/dL
HEMOGLOBIN A1C: 8.3 % — AB (ref 4.8–5.6)

## 2016-04-04 LAB — PSA: Prostate Specific Ag, Serum: 0.7 ng/mL (ref 0.0–4.0)

## 2016-04-06 ENCOUNTER — Other Ambulatory Visit: Payer: Self-pay

## 2016-04-06 MED ORDER — SAXAGLIPTIN HCL 2.5 MG PO TABS: 2.5000 mg | ORAL_TABLET | Freq: Every day | ORAL | 1 refills | Status: DC

## 2016-07-06 ENCOUNTER — Ambulatory Visit
Admission: EM | Admit: 2016-07-06 | Discharge: 2016-07-06 | Disposition: A | Discharge status: Home / Self Care | Payer: BLUE CROSS/BLUE SHIELD | Attending: Family Medicine | Admitting: Family Medicine

## 2016-07-06 ENCOUNTER — Ambulatory Visit (INDEPENDENT_AMBULATORY_CARE_PROVIDER_SITE_OTHER): Payer: BLUE CROSS/BLUE SHIELD | Admitting: Family Medicine

## 2016-07-06 ENCOUNTER — Encounter: Payer: Self-pay | Admitting: Family Medicine

## 2016-07-06 ENCOUNTER — Encounter: Payer: Self-pay | Admitting: *Deleted

## 2016-07-06 DIAGNOSIS — Z024 Encounter for examination for driving license: Secondary | ICD-10-CM

## 2016-07-06 DIAGNOSIS — I10 Essential (primary) hypertension: Secondary | ICD-10-CM

## 2016-07-06 DIAGNOSIS — E1140 Type 2 diabetes mellitus with diabetic neuropathy, unspecified: Secondary | ICD-10-CM

## 2016-07-06 DIAGNOSIS — Z029 Encounter for administrative examinations, unspecified: Secondary | ICD-10-CM

## 2016-07-06 DIAGNOSIS — E782 Mixed hyperlipidemia: Secondary | ICD-10-CM

## 2016-07-06 LAB — DEPT OF TRANSP DIPSTICK, URINE (ARMC ONLY)
Glucose, UA: NEGATIVE mg/dL
Hgb urine dipstick: NEGATIVE
PROTEIN: NEGATIVE mg/dL
Specific Gravity, Urine: <1.005 — ABNORMAL LOW (ref 1.005–1.030)

## 2016-07-06 MED ORDER — GLIPIZIDE 10 MG PO TABS: 10.0000 mg | ORAL_TABLET | Freq: Two times a day (BID) | ORAL | 1 refills | Status: DC

## 2016-07-06 MED ORDER — LISINOPRIL 20 MG PO TABS: 20.0000 mg | ORAL_TABLET | Freq: Every day | ORAL | 1 refills | Status: DC

## 2016-07-06 MED ORDER — METFORMIN HCL 1000 MG PO TABS: 1000.0000 mg | ORAL_TABLET | Freq: Two times a day (BID) | ORAL | 1 refills | Status: DC

## 2016-07-06 MED ORDER — ATORVASTATIN CALCIUM 40 MG PO TABS: 40.0000 mg | ORAL_TABLET | Freq: Every day | ORAL | 1 refills | Status: DC

## 2016-07-06 NOTE — ED Provider Notes (Signed)
CSN: [id number]     Arrival date & time 07/06/16  3419 History   First MD Initiated Contact with Patient 07/06/16 1101     Chief Complaint  Patient presents with  . 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 Last physical done in July 2017. Has history of HTN, type 2 Diabetes, hyperlipidemia, and TIA (over 20 years ago). Currently on medication for those conditions. Just had his annual physical with his PCP today and Hgb Alc was 7.5 which is down from 8.0 last year. He has also lost 10 pounds from last year. Blood pressure is under control. Has history of various surgeries but no complications or long-term effects. Has full function of all extremities. Does not smoke. No other concerns today.   The history is provided by the patient.    Past Medical History:  Diagnosis Date  . AK (actinic keratosis)   . Diabetes mellitus without complication ([place])   . History of TIA (transient ischemic attack)   . Hypercholesteremia   . Hyperlipidemia   . Hypertension   . Kidney stones    Past Surgical History:  Procedure Laterality Date  . FOOT SURGERY    . HERNIA REPAIR     x2  . SPLENECTOMY     Family History  Problem Relation Age of Onset  . Heart disease Mother   . Heart attack Father   . Heart disease Father    Social History  Substance Use Topics  . Smoking status: Former Research scientist (life sciences)  . Smokeless tobacco: Never Used  . Alcohol use No    Review of Systems  Constitutional: Negative.   HENT: Negative.   Eyes: Negative.   Respiratory: Negative.   Cardiovascular: Negative.   Gastrointestinal: Negative.   Endocrine: Negative.   Genitourinary: Negative.   Musculoskeletal: Negative.   Skin: Negative.   Neurological: Negative.   Hematological: Negative.   Psychiatric/Behavioral: Negative.     Allergies  Patient has no known allergies.  Home  Medications   Prior to Admission medications   Medication Sig Start Date End Date Taking? Authorizing Provider  aspirin 81 MG tablet Take 81 mg by mouth daily.    [provider]  atorvastatin (LIPITOR) 40 MG tablet Take 1 tablet (40 mg total) by mouth daily. 07/06/16   Juline Patch, MD  blood glucose meter kit and supplies Dispense based on patient and insurance preference. Use up to four times daily as directed. (FOR ICD-9 250.00, 250.01). 04/12/15   Plonk, Gwyndolyn Saxon, MD  glipiZIDE (GLUCOTROL) 10 MG tablet Take 1 tablet (10 mg total) by mouth 2 (two) times daily before a meal. 07/06/16   Juline Patch, MD  lisinopril (PRINIVIL,ZESTRIL) 20 MG tablet Take 1 tablet (20 mg total) by mouth daily. 07/06/16   Juline Patch, MD  metFORMIN (GLUCOPHAGE) 1000 MG tablet Take 1 tablet (1,000 mg total) by mouth 2 (two) times daily. 07/06/16   Juline Patch, MD  tamsulosin (FLOMAX) 0.4 MG CAPS capsule Take 1 capsule (0.4 mg total) by mouth daily after breakfast. 04/03/16   Juline Patch, MD   Meds Ordered and Administered this Visit  Medications - No data to display  BP 136/78 (BP Location: Left Arm)   Pulse 97   Temp 98.5 F (36.9 C) (Oral)   Resp 16   Ht _0  (1.702 m)   Wt 179 lb (81.2 kg)   SpO2 99%   BMI 28.04  kg/m  No data found.   Physical Exam  Constitutional: He is oriented to person, place, and time. Vital signs are normal. He appears well-developed and well-nourished. No distress.  HENT:  Head: Normocephalic and atraumatic.  Right Ear: External ear normal.  Left Ear: External ear normal.  Nose: Nose normal.  Mouth/Throat: Oropharynx is clear and moist.  Eyes: Conjunctivae and EOM are normal. Pupils are equal, round, and reactive to light.  Neck: Normal range of motion. Neck supple.  Cardiovascular: Normal rate, regular rhythm, normal heart sounds, intact distal pulses and normal pulses.   No murmur heard. Pulmonary/Chest: Effort normal and breath sounds normal. No  respiratory distress. He has no wheezes.  Abdominal: Soft. Bowel sounds are normal. He exhibits no mass. There is no tenderness. There is no guarding. Hernia confirmed negative in the right inguinal area and confirmed negative in the left inguinal area.  Musculoskeletal: Normal range of motion.  Lymphadenopathy: No inguinal adenopathy noted on the right or left side.  Neurological: He is alert and oriented to person, place, and time. He has normal strength and normal reflexes. No sensory deficit. He displays a negative Romberg sign.  Skin: Skin is warm and dry. Capillary refill takes less than 2 seconds.  Psychiatric: He has a normal mood and affect. His behavior is normal. Judgment and thought content normal.    Urgent Care Course     Procedures (including critical care time)  Labs Review Labs Reviewed  DEPT OF TRANSP DIPSTICK, URINE([facility] ONLY) - Abnormal; Notable for the following:       Result Value   Specific Gravity, Urine <1.005 (*)    All other components within normal limits    Imaging Review No results found.   Visual Acuity Review  Right Eye Distance:  20/13 corrected Left Eye Distance:  20/13 corrected Bilateral Distance:  20/10 corrected   Right Eye Near:   Left Eye Near:    Bilateral Near:         MDM   1. Encounter for commercial driver medical examination (CDME)   2. Essential hypertension    DOT forms completed- see scanned copies in chart. He is certified for 1 year due to HTN. No abnormalities found on exam. Continue to remain active and continue current medication for management of DM. Follow-up in June 2019  for next DOT exam.     Katy Apo, NP 07/07/16 1201

## 2016-07-06 NOTE — Progress Notes (Addendum)
Name: Melvin DERRICK Sr.   MRN: [id number]    DOB: Mar 09, 1960   Date:07/06/2016       Progress Note  Subjective  Chief Complaint  Chief Complaint  Patient presents with  . Diabetes    Diabetes  He presents for his follow-up diabetic visit. He has type 2 diabetes mellitus. His disease course has been stable. Pertinent negatives for hypoglycemia include no confusion, dizziness, headaches, hunger, mood changes, nervousness/anxiousness, pallor, seizures, sleepiness, speech difficulty, sweats or tremors. Associated symptoms include weight loss. Pertinent negatives for diabetes include no blurred vision, no chest pain, no fatigue, no foot paresthesias, no foot ulcerations, no polydipsia, no polyphagia, no polyuria, no visual change and no weakness. There are no hypoglycemic complications. Pertinent negatives for hypoglycemia complications include no blackouts. Symptoms are stable. There are no diabetic complications. Pertinent negatives for diabetic complications include no CVA, PVD or retinopathy. There are no known risk factors for coronary artery disease. Current diabetic treatment includes diet and oral agent (dual therapy). His weight is stable. He is following a generally healthy diet. Meal planning includes avoidance of concentrated sweets. He participates in exercise intermittently. There is no change (A1c 06/26/16 7.5) in his home blood glucose trend. His breakfast blood glucose is taken between 8-9 am. His breakfast blood glucose range is generally 110-130 mg/dl. An ACE inhibitor/angiotensin II receptor blocker is being taken. He does not see a podiatrist.Eye exam is current.  Hyperlipidemia  This is a chronic problem. The problem is controlled. Recent lipid tests were reviewed and are normal. Exacerbating diseases include diabetes. There are no known factors aggravating his hyperlipidemia. Pertinent negatives include no chest pain, focal weakness, myalgias or shortness of breath. Current  antihyperlipidemic treatment includes statins. The current treatment provides moderate improvement of lipids. There are no compliance problems.  Risk factors for coronary artery disease include dyslipidemia and hypertension.  Hypertension  This is a chronic problem. The current episode started more than 1 year ago. The problem is unchanged. The problem is controlled. Pertinent negatives include no blurred vision, chest pain, headaches, malaise/fatigue, neck pain, palpitations, shortness of breath or sweats. Past treatments include diuretics and ACE inhibitors. There are no compliance problems.  There is no history of angina, kidney disease, CVA, heart failure, left ventricular hypertrophy, PVD or retinopathy.    No problem-specific Assessment & Plan notes found for this encounter.   Past Medical History:  Diagnosis Date  . AK (actinic keratosis)   . Diabetes mellitus without complication ([place])   . History of TIA (transient ischemic attack)   . Hypercholesteremia   . Hyperlipidemia   . Hypertension   . Kidney stones     Past Surgical History:  Procedure Laterality Date  . FOOT SURGERY    . HERNIA REPAIR     x2  . SPLENECTOMY      Family History  Problem Relation Age of Onset  . Heart disease Mother   . Heart attack Father   . Heart disease Father     Social History   Social History  . Marital status: Married    Spouse name: N/A  . Number of children: N/A  . Years of education: N/A   Occupational History  . Not on file.   Social History Main Topics  . Smoking status: Former Research scientist (life sciences)  . Smokeless tobacco: Never Used  . Alcohol use No  . Drug use: No  . Sexual activity: Not on file   Other Topics Concern  . Not on file  Social History Narrative  . No narrative on file    No Known Allergies  Outpatient Medications Prior to Visit  Medication Sig Dispense Refill  . aspirin 81 MG tablet Take 81 mg by mouth daily.    . blood glucose meter kit and supplies  Dispense based on patient and insurance preference. Use up to four times daily as directed. (FOR ICD-9 250.00, 250.01). 1 each 0  . tamsulosin (FLOMAX) 0.4 MG CAPS capsule Take 1 capsule (0.4 mg total) by mouth daily after breakfast. 90 capsule 1  . atorvastatin (LIPITOR) 40 MG tablet Take 1 tablet (40 mg total) by mouth daily. 90 tablet 1  . glipiZIDE (GLUCOTROL) 10 MG tablet Take 1 tablet (10 mg total) by mouth 2 (two) times daily before a meal. 90 tablet 1  . lisinopril (PRINIVIL,ZESTRIL) 20 MG tablet Take 1 tablet (20 mg total) by mouth daily. 90 tablet 1  . metFORMIN (GLUCOPHAGE) 1000 MG tablet Take 1 tablet (1,000 mg total) by mouth 2 (two) times daily. 180 tablet 1  . saxagliptin HCl (ONGLYZA) 2.5 MG TABS tablet Take 1 tablet (2.5 mg total) by mouth daily. (Patient not taking: Reported on 07/06/2016) 30 tablet 1   No facility-administered medications prior to visit.     Review of Systems  Constitutional: Positive for weight loss. Negative for chills, fatigue, fever and malaise/fatigue.  HENT: Negative for ear discharge, ear pain and sore throat.   Eyes: Negative for blurred vision.  Respiratory: Negative for cough, sputum production, shortness of breath and wheezing.   Cardiovascular: Negative for chest pain, palpitations and leg swelling.  Gastrointestinal: Negative for abdominal pain, blood in stool, constipation, diarrhea, heartburn, melena and nausea.  Genitourinary: Negative for dysuria, frequency, hematuria and urgency.  Musculoskeletal: Negative for back pain, joint pain, myalgias and neck pain.  Skin: Negative for pallor and rash.  Neurological: Negative for dizziness, tingling, tremors, sensory change, focal weakness, seizures, speech difficulty, weakness and headaches.  Endo/Heme/Allergies: Negative for environmental allergies, polydipsia and polyphagia. Does not bruise/bleed easily.  Psychiatric/Behavioral: Negative for confusion, depression and suicidal ideas. The patient is  not nervous/anxious and does not have insomnia.      Objective  Vitals:   07/06/16 0818  BP: 138/80  Pulse: 90  Resp: 17  Temp: 98 F (36.7 C)  TempSrc: Oral  SpO2: 99%  Weight: 179 lb (81.2 kg)  Height: _0  (1.702 m)    Physical Exam  Constitutional: He is oriented to person, place, and time and well-developed, well-nourished, and in no distress.  HENT:  Head: Normocephalic.  Right Ear: External ear normal.  Left Ear: External ear normal.  Nose: Nose normal.  Mouth/Throat: Oropharynx is clear and moist.  Eyes: Conjunctivae and EOM are normal. Pupils are equal, round, and reactive to light. Right eye exhibits no discharge. Left eye exhibits no discharge. No scleral icterus.  Neck: Normal range of motion. Neck supple. No JVD present. No tracheal deviation present. No thyromegaly present.  Cardiovascular: Normal rate, regular rhythm, normal heart sounds and intact distal pulses.  Exam reveals no gallop and no friction rub.   No murmur heard. Pulmonary/Chest: Breath sounds normal. No respiratory distress. He has no wheezes. He has no rales.  Abdominal: Soft. Bowel sounds are normal. He exhibits no mass. There is no hepatosplenomegaly. There is no tenderness. There is no rebound, no guarding and no CVA tenderness.  Musculoskeletal: Normal range of motion. He exhibits no edema or tenderness.  Lymphadenopathy:    He has no cervical  adenopathy.  Neurological: He is alert and oriented to person, place, and time. He has normal sensation, normal strength, normal reflexes and intact cranial nerves. No cranial nerve deficit.  Skin: Skin is warm. No rash noted.  Psychiatric: Mood and affect normal.  Nursing note and vitals reviewed.     Assessment & Plan  Problem List Items Addressed This Visit      Cardiovascular and Mediastinum   HTN (hypertension)   Relevant Medications   atorvastatin (LIPITOR) 40 MG tablet   lisinopril (PRINIVIL,ZESTRIL) 20 MG tablet     Endocrine    Type 2 diabetes mellitus with diabetic neuropathy (HCC)   Relevant Medications   glipiZIDE (GLUCOTROL) 10 MG tablet   metFORMIN (GLUCOPHAGE) 1000 MG tablet   atorvastatin (LIPITOR) 40 MG tablet   lisinopril (PRINIVIL,ZESTRIL) 20 MG tablet     Other   HLD (hyperlipidemia)   Relevant Medications   atorvastatin (LIPITOR) 40 MG tablet   lisinopril (PRINIVIL,ZESTRIL) 20 MG tablet      Meds ordered this encounter  Medications  . glipiZIDE (GLUCOTROL) 10 MG tablet    Sig: Take 1 tablet (10 mg total) by mouth 2 (two) times daily before a meal.    Dispense:  180 tablet    Refill:  1  . metFORMIN (GLUCOPHAGE) 1000 MG tablet    Sig: Take 1 tablet (1,000 mg total) by mouth 2 (two) times daily.    Dispense:  180 tablet    Refill:  1  . atorvastatin (LIPITOR) 40 MG tablet    Sig: Take 1 tablet (40 mg total) by mouth daily.    Dispense:  90 tablet    Refill:  1  . lisinopril (PRINIVIL,ZESTRIL) 20 MG tablet    Sig: Take 1 tablet (20 mg total) by mouth daily.    Dispense:  90 tablet    Refill:  1      Dr. Otilio Miu [facility]  07/06/16

## 2016-07-06 NOTE — Discharge Instructions (Signed)
You have been certified for 1 year. Return in June 2019 for next exam.

## 2016-07-06 NOTE — ED Triage Notes (Signed)
DOT physical. 

## 2016-07-20 ENCOUNTER — Other Ambulatory Visit: Payer: Self-pay

## 2017-01-07 ENCOUNTER — Ambulatory Visit (INDEPENDENT_AMBULATORY_CARE_PROVIDER_SITE_OTHER): Payer: BLUE CROSS/BLUE SHIELD | Admitting: Family Medicine

## 2017-01-07 ENCOUNTER — Encounter: Payer: Self-pay | Admitting: Family Medicine

## 2017-01-07 VITALS — BP 124/80 | HR 64 | Ht 67.0 in | Wt 183.0 lb

## 2017-01-07 DIAGNOSIS — N400 Enlarged prostate without lower urinary tract symptoms: Secondary | ICD-10-CM

## 2017-01-07 DIAGNOSIS — Z23 Encounter for immunization: Secondary | ICD-10-CM | POA: Diagnosis not present

## 2017-01-07 DIAGNOSIS — I10 Essential (primary) hypertension: Secondary | ICD-10-CM

## 2017-01-07 DIAGNOSIS — Z1159 Encounter for screening for other viral diseases: Secondary | ICD-10-CM | POA: Diagnosis not present

## 2017-01-07 DIAGNOSIS — E1140 Type 2 diabetes mellitus with diabetic neuropathy, unspecified: Secondary | ICD-10-CM

## 2017-01-07 DIAGNOSIS — E782 Mixed hyperlipidemia: Secondary | ICD-10-CM

## 2017-01-07 DIAGNOSIS — Z1211 Encounter for screening for malignant neoplasm of colon: Secondary | ICD-10-CM

## 2017-01-07 LAB — HEMOCCULT GUIAC POC 1CARD (OFFICE): FECAL OCCULT BLD: NEGATIVE

## 2017-01-07 MED ORDER — ATORVASTATIN CALCIUM 40 MG PO TABS: 40.0000 mg | ORAL_TABLET | Freq: Every day | ORAL | 1 refills | Status: DC

## 2017-01-07 MED ORDER — TAMSULOSIN HCL 0.4 MG PO CAPS: 0.4000 mg | ORAL_CAPSULE | Freq: Every day | ORAL | 1 refills | Status: DC

## 2017-01-07 MED ORDER — METFORMIN HCL 1000 MG PO TABS: 1000.0000 mg | ORAL_TABLET | Freq: Two times a day (BID) | ORAL | 1 refills | Status: DC

## 2017-01-07 MED ORDER — GLIPIZIDE 10 MG PO TABS: 10.0000 mg | ORAL_TABLET | Freq: Two times a day (BID) | ORAL | 1 refills | Status: DC

## 2017-01-07 MED ORDER — LISINOPRIL 20 MG PO TABS
20.0000 mg | ORAL_TABLET | Freq: Every day | ORAL | 1 refills | Status: DC
Start: 2017-01-07 — End: 2017-04-02

## 2017-01-07 NOTE — Progress Notes (Signed)
Name: Melvin ATKERSON Sr.   MRN: [id number]    DOB: 1960-05-04   Date:01/07/2017       Progress Note  Subjective  Chief Complaint  Chief Complaint  Patient presents with  . Diabetes  . Hyperlipidemia  . Hypertension    Diabetes  He presents for his follow-up diabetic visit. He has type 2 diabetes mellitus. His disease course has been stable. Pertinent negatives for hypoglycemia include no confusion, dizziness, headaches, hunger, mood changes, nervousness/anxiousness, pallor, seizures, sleepiness, speech difficulty, sweats or tremors. Pertinent negatives for diabetes include no blurred vision, no chest pain, no fatigue, no foot paresthesias, no foot ulcerations, no polydipsia, no polyphagia, no visual change, no weakness and no weight loss. Symptoms are stable. There are no known risk factors for coronary artery disease. Current diabetic treatment includes oral agent (dual therapy). He is compliant with treatment all of the time. His weight is stable. He is following a generally healthy diet. Meal planning includes avoidance of concentrated sweets. He participates in exercise intermittently. His breakfast blood glucose is taken between 8-9 am. His breakfast blood glucose range is generally 140-180 mg/dl. An ACE inhibitor/angiotensin II receptor blocker is being taken.  Hyperlipidemia  This is a chronic problem. The current episode started more than 1 year ago. The problem is controlled. Recent lipid tests were reviewed and are normal. Exacerbating diseases include diabetes. He has no history of chronic renal disease, hypothyroidism, liver disease, obesity or nephrotic syndrome. There are no known factors aggravating his hyperlipidemia. Pertinent negatives include no chest pain, focal weakness, myalgias or shortness of breath. Current antihyperlipidemic treatment includes statins. The current treatment provides moderate improvement of lipids. There are no compliance problems.  Risk factors for  coronary artery disease include diabetes mellitus, dyslipidemia, hypertension and male sex.  Hypertension  This is a chronic problem. The current episode started more than 1 year ago. The problem is controlled. Pertinent negatives include no anxiety, blurred vision, chest pain, headaches, malaise/fatigue, neck pain, orthopnea, palpitations, peripheral edema, PND, shortness of breath or sweats. There are no associated agents to hypertension. Risk factors for coronary artery disease include diabetes mellitus and dyslipidemia. Past treatments include ACE inhibitors. The current treatment provides no improvement. There is no history of chronic renal disease.    No problem-specific Assessment & Plan notes found for this encounter.   Past Medical History:  Diagnosis Date  . AK (actinic keratosis)   . Diabetes mellitus without complication ([place])   . History of TIA (transient ischemic attack)   . Hypercholesteremia   . Hyperlipidemia   . Hypertension   . Kidney stones     Past Surgical History:  Procedure Laterality Date  . FOOT SURGERY    . HERNIA REPAIR     x2  . SPLENECTOMY      Family History  Problem Relation Age of Onset  . Heart disease Mother   . Heart attack Father   . Heart disease Father     Social History   Socioeconomic History  . Marital status: Married    Spouse name: Not on file  . Number of children: Not on file  . Years of education: Not on file  . Highest education level: Not on file  Social Needs  . Financial resource strain: Not on file  . Food insecurity - worry: Not on file  . Food insecurity - inability: Not on file  . Transportation needs - medical: Not on file  . Transportation needs - non-medical: Not on file  Occupational History  . Not on file  Tobacco Use  . Smoking status: Former Research scientist (life sciences)  . Smokeless tobacco: Never Used  Substance and Sexual Activity  . Alcohol use: No    Alcohol/week: 0.0 oz  . Drug use: No  . Sexual activity: Not on  file  Other Topics Concern  . Not on file  Social History Narrative  . Not on file    No Known Allergies  Outpatient Medications Prior to Visit  Medication Sig Dispense Refill  . aspirin 81 MG tablet Take 81 mg by mouth daily.    . blood glucose meter kit and supplies Dispense based on patient and insurance preference. Use up to four times daily as directed. (FOR ICD-9 250.00, 250.01). 1 each 0  . atorvastatin (LIPITOR) 40 MG tablet Take 1 tablet (40 mg total) by mouth daily. 90 tablet 1  . glipiZIDE (GLUCOTROL) 10 MG tablet Take 1 tablet (10 mg total) by mouth 2 (two) times daily before a meal. 180 tablet 1  . lisinopril (PRINIVIL,ZESTRIL) 20 MG tablet Take 1 tablet (20 mg total) by mouth daily. 90 tablet 1  . metFORMIN (GLUCOPHAGE) 1000 MG tablet Take 1 tablet (1,000 mg total) by mouth 2 (two) times daily. 180 tablet 1  . tamsulosin (FLOMAX) 0.4 MG CAPS capsule Take 1 capsule (0.4 mg total) by mouth daily after breakfast. 90 capsule 1   No facility-administered medications prior to visit.     Review of Systems  Constitutional: Negative for chills, fatigue, fever, malaise/fatigue and weight loss.  HENT: Negative for ear discharge, ear pain and sore throat.   Eyes: Negative for blurred vision.  Respiratory: Negative for cough, sputum production, shortness of breath and wheezing.   Cardiovascular: Negative for chest pain, palpitations, orthopnea, leg swelling and PND.  Gastrointestinal: Negative for abdominal pain, blood in stool, constipation, diarrhea, heartburn, melena and nausea.  Genitourinary: Negative for dysuria, frequency, hematuria and urgency.  Musculoskeletal: Negative for back pain, joint pain, myalgias and neck pain.  Skin: Negative for pallor and rash.  Neurological: Negative for dizziness, tingling, tremors, sensory change, focal weakness, seizures, speech difficulty, weakness and headaches.  Endo/Heme/Allergies: Negative for environmental allergies, polydipsia and  polyphagia. Does not bruise/bleed easily.  Psychiatric/Behavioral: Negative for confusion, depression and suicidal ideas. The patient is not nervous/anxious and does not have insomnia.      Objective  Vitals:   01/07/17 0814  BP: 124/80  Pulse: 64  Weight: 183 lb (83 kg)  Height: _0  (1.702 m)    Physical Exam  Constitutional: He is oriented to person, place, and time and well-developed, well-nourished, and in no distress.  HENT:  Head: Normocephalic.  Right Ear: Tympanic membrane, external ear and ear canal normal.  Left Ear: Tympanic membrane, external ear and ear canal normal.  Nose: Nose normal.  Mouth/Throat: Oropharynx is clear and moist.  Eyes: Conjunctivae and EOM are normal. Pupils are equal, round, and reactive to light. Right eye exhibits no discharge. Left eye exhibits no discharge. No scleral icterus.  Neck: Normal range of motion. Neck supple. Normal carotid pulses, no hepatojugular reflux and no JVD present. Carotid bruit is not present. No tracheal deviation present. No thyromegaly present.  Cardiovascular: Normal rate, regular rhythm, S1 normal, S2 normal, normal heart sounds and intact distal pulses. Exam reveals no gallop, no S3, no S4 and no friction rub.  No murmur heard. Pulmonary/Chest: Breath sounds normal. No respiratory distress. He has no wheezes. He has no rales.  Abdominal: Soft. Bowel sounds  are normal. He exhibits no mass. There is no hepatosplenomegaly. There is no tenderness. There is no rebound, no guarding and no CVA tenderness.  Genitourinary: Rectum normal and prostate normal. Rectal exam shows no external hemorrhoid and guaiac negative stool.  Musculoskeletal: Normal range of motion. He exhibits no edema or tenderness.  Lymphadenopathy:       Head (right side): No submandibular adenopathy present.       Head (left side): No submandibular adenopathy present.    He has no cervical adenopathy.  Neurological: He is alert and oriented to person,  place, and time. He has normal sensation, normal strength, normal reflexes and intact cranial nerves. No cranial nerve deficit.  Monofilament normal  Skin: Skin is warm. No rash noted.  Psychiatric: Mood and affect normal.  Nursing note and vitals reviewed.     Assessment & Plan  Problem List Items Addressed This Visit      Cardiovascular and Mediastinum   HTN (hypertension)   Relevant Medications   lisinopril (PRINIVIL,ZESTRIL) 20 MG tablet   atorvastatin (LIPITOR) 40 MG tablet   Other Relevant Orders   Renal Function Panel     Endocrine   Type 2 diabetes mellitus with diabetic neuropathy (HCC) - Primary   Relevant Medications   lisinopril (PRINIVIL,ZESTRIL) 20 MG tablet   atorvastatin (LIPITOR) 40 MG tablet   metFORMIN (GLUCOPHAGE) 1000 MG tablet   glipiZIDE (GLUCOTROL) 10 MG tablet   Other Relevant Orders   Renal Function Panel   Lipid panel   Hemoglobin A1c   Microalbumin / creatinine urine ratio     Other   HLD (hyperlipidemia)   Relevant Medications   lisinopril (PRINIVIL,ZESTRIL) 20 MG tablet   atorvastatin (LIPITOR) 40 MG tablet   Other Relevant Orders   Lipid panel    Other Visit Diagnoses    Benign prostatic hyperplasia without lower urinary tract symptoms       Relevant Medications   tamsulosin (FLOMAX) 0.4 MG CAPS capsule   Other Relevant Orders   PSA   Need for hepatitis C screening test       Relevant Orders   Hepatitis C antibody   Colon cancer screening       Relevant Orders   Ambulatory referral to Gastroenterology   POCT occult blood stool (Completed)   Flu vaccine need       Relevant Orders   Flu Vaccine QUAD 6+ mos PF IM (Fluarix Quad PF) (Completed)      Meds ordered this encounter  Medications  . tamsulosin (FLOMAX) 0.4 MG CAPS capsule    Sig: Take 1 capsule (0.4 mg total) by mouth daily after breakfast.    Dispense:  90 capsule    Refill:  1  . lisinopril (PRINIVIL,ZESTRIL) 20 MG tablet    Sig: Take 1 tablet (20 mg total) by  mouth daily.    Dispense:  90 tablet    Refill:  1  . atorvastatin (LIPITOR) 40 MG tablet    Sig: Take 1 tablet (40 mg total) by mouth daily.    Dispense:  90 tablet    Refill:  1  . metFORMIN (GLUCOPHAGE) 1000 MG tablet    Sig: Take 1 tablet (1,000 mg total) by mouth 2 (two) times daily.    Dispense:  180 tablet    Refill:  1  . glipiZIDE (GLUCOTROL) 10 MG tablet    Sig: Take 1 tablet (10 mg total) by mouth 2 (two) times daily before a meal.    Dispense:  180 tablet    Refill:  1      Dr. Otilio Miu [facility]  01/07/17

## 2017-01-08 ENCOUNTER — Telehealth: Payer: Self-pay

## 2017-01-08 LAB — MICROALBUMIN / CREATININE URINE RATIO
Creatinine, Urine: 115.3 mg/dL
MICROALBUM., U, RANDOM: 4.2 ug/mL
Microalb/Creat Ratio: 3.6 mg/g creat (ref 0.0–30.0)

## 2017-01-08 LAB — RENAL FUNCTION PANEL
ALBUMIN: 4.1 g/dL (ref 3.5–5.5)
BUN/Creatinine Ratio: 12 (ref 9–20)
BUN: 14 mg/dL (ref 6–24)
CHLORIDE: 95 mmol/L — AB (ref 96–106)
CO2: 26 mmol/L (ref 20–29)
Calcium: 9.9 mg/dL (ref 8.7–10.2)
Creatinine, Ser: 1.18 mg/dL (ref 0.76–1.27)
GFR, EST AFRICAN AMERICAN: 79 mL/min/{1.73_m2} (ref >59)
GFR, EST NON AFRICAN AMERICAN: 68 mL/min/{1.73_m2} (ref >59)
Glucose: 200 mg/dL — ABNORMAL HIGH (ref 65–99)
Phosphorus: 4 mg/dL (ref 2.5–4.5)
Potassium: 4.4 mmol/L (ref 3.5–5.2)
Sodium: 139 mmol/L (ref 134–144)

## 2017-01-08 LAB — HEPATITIS C ANTIBODY

## 2017-01-08 LAB — LIPID PANEL
Chol/HDL Ratio: 3.3 ratio (ref 0.0–5.0)
Cholesterol, Total: 174 mg/dL (ref 100–199)
HDL: 53 mg/dL (ref >39)
LDL Calculated: 98 mg/dL (ref 0–99)
Triglycerides: 114 mg/dL (ref 0–149)
VLDL CHOLESTEROL CAL: 23 mg/dL (ref 5–40)

## 2017-01-08 LAB — HEMOGLOBIN A1C
Est. average glucose Bld gHb Est-mCnc: 212 mg/dL
HEMOGLOBIN A1C: 9 % — AB (ref 4.8–5.6)

## 2017-01-08 LAB — PSA: Prostate Specific Ag, Serum: 0.7 ng/mL (ref 0.0–4.0)

## 2017-01-08 NOTE — Telephone Encounter (Signed)
Pt has elevated A1c- will try to get him on a regimen of taking meds twice a day as prescribed. He will come back on Feb 7th with glucose numbers and recheck of A1C. If not better, will discuss possibilities of Actos, Invokana, Victoza, Januvia. Pt is a truck driver with large deductible.

## 2017-01-25 ENCOUNTER — Telehealth: Payer: Self-pay

## 2017-01-25 NOTE — Telephone Encounter (Signed)
Gastroenterology Pre-Procedure Review  Request Date:  Requesting Physician: Dr.   PATIENT REVIEW QUESTIONS: The patient responded to the following health history questions as indicated:    1. Are you having any GI issues?  No   2. Do you have a personal history of Polyps? No  3. Do you have a family history of Colon Cancer or Polyps? No   4. Diabetes Mellitus? Yes  5. Joint replacements in the past 12 months? No  6. Major health problems in the past 3 months? No  7. Any artificial heart valves, MVP, or defibrillator? No implants, TIA 20+ years ago    MEDICATIONS & ALLERGIES:    Patient reports the following regarding taking any anticoagulation/antiplatelet therapy:   Plavix, Coumadin, Eliquis, Xarelto, Lovenox, Pradaxa, Brilinta, or Effient? No  Aspirin? Yes, 81 mg   Patient confirms/reports the following medications:  Current Outpatient Medications  Medication Sig Dispense Refill  . aspirin 81 MG tablet Take 81 mg by mouth daily.    Marland Kitchen atorvastatin (LIPITOR) 40 MG tablet Take 1 tablet (40 mg total) by mouth daily. 90 tablet 1  . blood glucose meter kit and supplies Dispense based on patient and insurance preference. Use up to four times daily as directed. (FOR ICD-9 250.00, 250.01). 1 each 0  . glipiZIDE (GLUCOTROL) 10 MG tablet Take 1 tablet (10 mg total) by mouth 2 (two) times daily before a meal. 180 tablet 1  . lisinopril (PRINIVIL,ZESTRIL) 20 MG tablet Take 1 tablet (20 mg total) by mouth daily. 90 tablet 1  . metFORMIN (GLUCOPHAGE) 1000 MG tablet Take 1 tablet (1,000 mg total) by mouth 2 (two) times daily. 180 tablet 1  . tamsulosin (FLOMAX) 0.4 MG CAPS capsule Take 1 capsule (0.4 mg total) by mouth daily after breakfast. 90 capsule 1   No current facility-administered medications for this visit.     Patient confirms/reports the following allergies:  No Known Allergies  No orders of the defined types were placed in this encounter.   AUTHORIZATION INFORMATION Primary  Insurance: 1D#: Group #:  Secondary Insurance: 1D#: Group #:  SCHEDULE INFORMATION: Date: 02/12/17 Time: Location: [place]

## 2017-01-28 ENCOUNTER — Other Ambulatory Visit: Payer: Self-pay

## 2017-01-28 ENCOUNTER — Telehealth: Payer: Self-pay

## 2017-01-28 DIAGNOSIS — Z1211 Encounter for screening for malignant neoplasm of colon: Secondary | ICD-10-CM

## 2017-01-28 NOTE — Telephone Encounter (Signed)
Gastroenterology Pre-Procedure Review  Request Date: 02/12/17 Requesting Physician: Dr. Allen Norris  PATIENT REVIEW QUESTIONS: The patient responded to the following health history questions as indicated:    1. Are you having any GI issues? no 2. Do you have a personal history of Polyps? no 3. Do you have a family history of Colon Cancer or Polyps? no 4. Diabetes Mellitus? yes (type 2) 5. Joint replacements in the past 12 months?no 6. Major health problems in the past 3 months?no 7. Any artificial heart valves, MVP, or defibrillator?no    MEDICATIONS & ALLERGIES:    Patient reports the following regarding taking any anticoagulation/antiplatelet therapy:   Plavix, Coumadin, Eliquis, Xarelto, Lovenox, Pradaxa, Brilinta, or Effient? no Aspirin? yes (81 mg)  Patient confirms/reports the following medications:  Current Outpatient Medications  Medication Sig Dispense Refill  . aspirin 81 MG tablet Take 81 mg by mouth daily.    Marland Kitchen atorvastatin (LIPITOR) 40 MG tablet Take 1 tablet (40 mg total) by mouth daily. 90 tablet 1  . blood glucose meter kit and supplies Dispense based on patient and insurance preference. Use up to four times daily as directed. (FOR ICD-9 250.00, 250.01). 1 each 0  . glipiZIDE (GLUCOTROL) 10 MG tablet Take 1 tablet (10 mg total) by mouth 2 (two) times daily before a meal. 180 tablet 1  . lisinopril (PRINIVIL,ZESTRIL) 20 MG tablet Take 1 tablet (20 mg total) by mouth daily. 90 tablet 1  . metFORMIN (GLUCOPHAGE) 1000 MG tablet Take 1 tablet (1,000 mg total) by mouth 2 (two) times daily. 180 tablet 1  . tamsulosin (FLOMAX) 0.4 MG CAPS capsule Take 1 capsule (0.4 mg total) by mouth daily after breakfast. 90 capsule 1   No current facility-administered medications for this visit.     Patient confirms/reports the following allergies:  No Known Allergies  No orders of the defined types were placed in this encounter.   AUTHORIZATION INFORMATION Primary Insurance: 1D#: Group  #:  Secondary Insurance: 1D#: Group #:  SCHEDULE INFORMATION: Date: 02/12/17 Time: Location:[facility]

## 2017-02-08 ENCOUNTER — Telehealth: Payer: Self-pay | Admitting: Gastroenterology

## 2017-02-08 ENCOUNTER — Other Ambulatory Visit: Payer: Self-pay

## 2017-02-08 MED ORDER — NA SULFATE-K SULFATE-MG SULF 17.5-3.13-1.6 GM/177ML PO SOLN: 1.0000 | Freq: Once | ORAL | 0 refills | Status: AC

## 2017-02-08 NOTE — Progress Notes (Signed)
LVM for pt to let him rx for Suprep has been faxed to [facility].

## 2017-02-08 NOTE — Telephone Encounter (Signed)
PATIENT CALLED & L/M ON ANSWERING MACHINE. HE HAS AN APPOINTMENT FOR A COLONOSCOPY ON 02-12-17 WITH DR WOHL,BUT HAS NOT RECEIVED PREP. IT WAS NOT AT THE PHARMACY PLEASE LEAVE AT [address].

## 2017-02-09 ENCOUNTER — Other Ambulatory Visit: Payer: Self-pay

## 2017-02-09 ENCOUNTER — Encounter: Payer: Self-pay | Admitting: *Deleted

## 2017-02-09 NOTE — Discharge Instructions (Signed)
General Anesthesia, Adult, Care After °These instructions provide you with information about caring for yourself after your procedure. Your health care provider may also give you more specific instructions. Your treatment has been planned according to current medical practices, but problems sometimes occur. Call your health care provider if you have any problems or questions after your procedure. °What can I expect after the procedure? °After the procedure, it is common to have: °· Vomiting. °· A sore throat. °· Mental slowness. ° °It is common to feel: °· Nauseous. °· Cold or shivery. °· Sleepy. °· Tired. °· Sore or achy, even in parts of your body where you did not have surgery. ° °Follow these instructions at home: °For at least 24 hours after the procedure: °· Do not: °? Participate in activities where you could fall or become injured. °? Drive. °? Use heavy machinery. °? Drink alcohol. °? Take sleeping pills or medicines that cause drowsiness. °? Make important decisions or sign legal documents. °? Take care of children on your own. °· Rest. °Eating and drinking °· If you vomit, drink water, juice, or soup when you can drink without vomiting. °· Drink enough fluid to keep your urine clear or pale yellow. °· Make sure you have little or no nausea before eating solid foods. °· Follow the diet recommended by your health care provider. °General instructions °· Have a responsible adult stay with you until you are awake and alert. °· Return to your normal activities as told by your health care provider. Ask your health care provider what activities are safe for you. °· Take over-the-counter and prescription medicines only as told by your health care provider. °· If you smoke, do not smoke without supervision. °· Keep all follow-up visits as told by your health care provider. This is important. °Contact a health care provider if: °· You continue to have nausea or vomiting at home, and medicines are not helpful. °· You  cannot drink fluids or start eating again. °· You cannot urinate after 8-12 hours. °· You develop a skin rash. °· You have fever. °· You have increasing redness at the site of your procedure. °Get help right away if: °· You have difficulty breathing. °· You have chest pain. °· You have unexpected bleeding. °· You feel that you are having a life-threatening or urgent problem. °This information is not intended to replace advice given to you by your health care provider. Make sure you discuss any questions you have with your health care provider. °Document Released: 04/06/2000 Document Revised: 06/03/2015 Document Reviewed: 12/13/2014 °Elsevier Interactive Patient Education © 2018 [employer] ° °

## 2017-02-12 ENCOUNTER — Encounter
Admission: RE | Disposition: A | Discharge status: Home / Self Care | Payer: Self-pay | Source: Ambulatory Visit | Attending: Gastroenterology

## 2017-02-12 ENCOUNTER — Ambulatory Visit: Payer: BLUE CROSS/BLUE SHIELD | Admitting: Anesthesiology

## 2017-02-12 ENCOUNTER — Ambulatory Visit
Admission: RE | Admit: 2017-02-12 | Discharge: 2017-02-12 | Disposition: A | Discharge status: Home / Self Care | Payer: BLUE CROSS/BLUE SHIELD | Source: Ambulatory Visit | Attending: Gastroenterology | Admitting: Gastroenterology

## 2017-02-12 DIAGNOSIS — K635 Polyp of colon: Secondary | ICD-10-CM | POA: Insufficient documentation

## 2017-02-12 DIAGNOSIS — Z8249 Family history of ischemic heart disease and other diseases of the circulatory system: Secondary | ICD-10-CM | POA: Diagnosis not present

## 2017-02-12 DIAGNOSIS — E7800 Pure hypercholesterolemia, unspecified: Secondary | ICD-10-CM | POA: Insufficient documentation

## 2017-02-12 DIAGNOSIS — Z87442 Personal history of urinary calculi: Secondary | ICD-10-CM | POA: Insufficient documentation

## 2017-02-12 DIAGNOSIS — Z87891 Personal history of nicotine dependence: Secondary | ICD-10-CM | POA: Diagnosis not present

## 2017-02-12 DIAGNOSIS — Z7984 Long term (current) use of oral hypoglycemic drugs: Secondary | ICD-10-CM | POA: Insufficient documentation

## 2017-02-12 DIAGNOSIS — Z1211 Encounter for screening for malignant neoplasm of colon: Secondary | ICD-10-CM

## 2017-02-12 DIAGNOSIS — Z7982 Long term (current) use of aspirin: Secondary | ICD-10-CM | POA: Insufficient documentation

## 2017-02-12 DIAGNOSIS — E119 Type 2 diabetes mellitus without complications: Secondary | ICD-10-CM | POA: Insufficient documentation

## 2017-02-12 DIAGNOSIS — D122 Benign neoplasm of ascending colon: Secondary | ICD-10-CM

## 2017-02-12 DIAGNOSIS — Z79899 Other long term (current) drug therapy: Secondary | ICD-10-CM | POA: Diagnosis not present

## 2017-02-12 DIAGNOSIS — Z8673 Personal history of transient ischemic attack (TIA), and cerebral infarction without residual deficits: Secondary | ICD-10-CM | POA: Insufficient documentation

## 2017-02-12 DIAGNOSIS — D123 Benign neoplasm of transverse colon: Secondary | ICD-10-CM | POA: Diagnosis not present

## 2017-02-12 DIAGNOSIS — I10 Essential (primary) hypertension: Secondary | ICD-10-CM | POA: Insufficient documentation

## 2017-02-12 DIAGNOSIS — K640 First degree hemorrhoids: Secondary | ICD-10-CM | POA: Diagnosis not present

## 2017-02-12 HISTORY — PX: POLYPECTOMY: SHX5525

## 2017-02-12 HISTORY — DX: Pre-excitation syndrome: I45.6

## 2017-02-12 HISTORY — PX: COLONOSCOPY WITH PROPOFOL: SHX5780

## 2017-02-12 LAB — GLUCOSE, CAPILLARY: GLUCOSE-CAPILLARY: 150 mg/dL — AB (ref 65–99)

## 2017-02-12 SURGERY — COLONOSCOPY WITH PROPOFOL
Anesthesia: General | Wound class: Contaminated

## 2017-02-12 MED ORDER — PROPOFOL 10 MG/ML IV BOLUS
INTRAVENOUS | Status: DC | PRN
  Administered 2017-02-12: 100 mg via INTRAVENOUS
  Administered 2017-02-12: 50 mg via INTRAVENOUS
  Administered 2017-02-12: 30 mg via INTRAVENOUS
  Administered 2017-02-12 (×4): 50 mg via INTRAVENOUS
  Administered 2017-02-12: 20 mg via INTRAVENOUS
  Administered 2017-02-12: 50 mg via INTRAVENOUS

## 2017-02-12 MED ORDER — OXYCODONE HCL 5 MG PO TABS: 5.0000 mg | ORAL_TABLET | Freq: Once | ORAL | Status: DC | PRN

## 2017-02-12 MED ORDER — SIMETHICONE 40 MG/0.6ML PO SUSP
ORAL | Status: DC | PRN
  Administered 2017-02-12: 11:00:00

## 2017-02-12 MED ORDER — LACTATED RINGERS IV SOLN
INTRAVENOUS | Status: DC
  Administered 2017-02-12: 10:00:00 via INTRAVENOUS

## 2017-02-12 MED ORDER — OXYCODONE HCL 5 MG/5ML PO SOLN: 5.0000 mg | Freq: Once | ORAL | Status: DC | PRN

## 2017-02-12 MED ORDER — LIDOCAINE HCL (CARDIAC) 20 MG/ML IV SOLN
INTRAVENOUS | Status: DC | PRN
  Administered 2017-02-12: 40 mg via INTRAVENOUS

## 2017-02-12 SURGICAL SUPPLY — 24 items
CANISTER SUCT 1200ML W/VALVE (MISCELLANEOUS) ×3 IMPLANT
CLIP HMST 235XBRD CATH ROT (MISCELLANEOUS) IMPLANT
CLIP RESOLUTION 360 11X235 (MISCELLANEOUS)
ELECT REM PT RETURN 9FT ADLT (ELECTROSURGICAL)
ELECTRODE REM PT RTRN 9FT ADLT (ELECTROSURGICAL) IMPLANT
FCP ESCP3.2XJMB 240X2.8X (MISCELLANEOUS)
FORCEPS BIOP RAD 4 LRG CAP 4 (CUTTING FORCEPS) ×3 IMPLANT
FORCEPS BIOP RJ4 240 W/NDL (MISCELLANEOUS)
FORCEPS ESCP3.2XJMB 240X2.8X (MISCELLANEOUS) IMPLANT
GOWN CVR UNV OPN BCK APRN NK (MISCELLANEOUS) ×4 IMPLANT
GOWN ISOL THUMB LOOP REG UNIV (MISCELLANEOUS) ×2
INJECTOR VARIJECT VIN23 (MISCELLANEOUS) IMPLANT
KIT DEFENDO VALVE AND CONN (KITS) IMPLANT
KIT ENDO PROCEDURE OLY (KITS) ×3 IMPLANT
MARKER SPOT ENDO TATTOO 5ML (MISCELLANEOUS) IMPLANT
PROBE APC STR FIRE (PROBE) IMPLANT
RETRIEVER NET ROTH 2.5X230 LF (MISCELLANEOUS) IMPLANT
SNARE SHORT THROW 13M SML OVAL (MISCELLANEOUS) ×3 IMPLANT
SNARE SHORT THROW 30M LRG OVAL (MISCELLANEOUS) IMPLANT
SNARE SNG USE RND 15MM (INSTRUMENTS) IMPLANT
SPOT EX ENDOSCOPIC TATTOO (MISCELLANEOUS)
TRAP ETRAP POLY (MISCELLANEOUS) ×3 IMPLANT
VARIJECT INJECTOR VIN23 (MISCELLANEOUS)
WATER STERILE IRR 250ML POUR (IV SOLUTION) ×3 IMPLANT

## 2017-02-12 NOTE — Anesthesia Procedure Notes (Signed)
Procedure Name: MAC Date/Time: 02/12/2017 10:54 AM Performed by: Janna Arch, CRNA Pre-anesthesia Checklist: Patient identified, Emergency Drugs available, Suction available and Patient being monitored Patient Re-evaluated:Patient Re-evaluated prior to induction Oxygen Delivery Method: Nasal cannula

## 2017-02-12 NOTE — Anesthesia Preprocedure Evaluation (Signed)
Anesthesia Evaluation   Patient awake    Reviewed: Allergy & Precautions, H&P , NPO status , Patient's Chart, lab work & pertinent test results  Airway Mallampati: II  TM Distance: >3 FB     Dental no notable dental hx.    Pulmonary former smoker,    Pulmonary exam normal        Cardiovascular hypertension, On Medications Normal cardiovascular exam     Neuro/Psych    GI/Hepatic negative GI ROS, Neg liver ROS,   Endo/Other  diabetes, Well Controlled, Type 2  Renal/GU      Musculoskeletal   Abdominal   Peds  Hematology negative hematology ROS (+)   Anesthesia Other Findings   Reproductive/Obstetrics negative OB ROS                             Anesthesia Physical Anesthesia Plan  ASA: II  Anesthesia Plan: General   Post-op Pain Management:    Induction:   PONV Risk Score and Plan:   Airway Management Planned:   Additional Equipment:   Intra-op Plan:   Post-operative Plan:   Informed Consent: I have reviewed the patients History and Physical, chart, labs and discussed the procedure including the risks, benefits and alternatives for the proposed anesthesia with the patient or authorized representative who has indicated his/her understanding and acceptance.     Plan Discussed with:   Anesthesia Plan Comments:         Anesthesia Quick Evaluation

## 2017-02-12 NOTE — Transfer of Care (Signed)
Immediate Anesthesia Transfer of Care Note  Patient: Melvin GINSBERG Sr.  Procedure(s) Performed: COLONOSCOPY WITH PROPOFOL (N/A ) POLYPECTOMY  Patient Location: PACU  Anesthesia Type: General  Level of Consciousness: awake, alert  and patient cooperative  Airway and Oxygen Therapy: Patient Spontanous Breathing and Patient connected to supplemental oxygen  Post-op Assessment: Post-op Vital signs reviewed, Patient's Cardiovascular Status Stable, Respiratory Function Stable, Patent Airway and No signs of Nausea or vomiting  Post-op Vital Signs: Reviewed and stable  Complications: No apparent anesthesia complications

## 2017-02-12 NOTE — Op Note (Signed)
[facility] Gastroenterology Patient Name: Melvin Torres Procedure Date: 02/12/2017 10:50 AM MRN: [id number] Account #: [id number] Date of Birth: 11-25-1960 Admit Type: Outpatient Age: 57 Room: [facility] OR ROOM 01 Gender: Male Note Status: Finalized Procedure:            Colonoscopy Indications:          Screening for colorectal malignant neoplasm Providers:            Lucilla Lame MD, MD Referring MD:         Juline Patch, MD (Referring MD) Medicines:            Propofol per Anesthesia Complications:        No immediate complications. Procedure:            Pre-Anesthesia Assessment:                       - Prior to the procedure, a History and Physical was                        performed, and patient medications and allergies were                        reviewed. The patient's tolerance of previous                        anesthesia was also reviewed. The risks and benefits of                        the procedure and the sedation options and risks were                        discussed with the patient. All questions were                        answered, and informed consent was obtained. Prior                        Anticoagulants: The patient has taken no previous                        anticoagulant or antiplatelet agents. ASA Grade                        Assessment: II - A patient with mild systemic disease.                        After reviewing the risks and benefits, the patient was                        deemed in satisfactory condition to undergo the                        procedure.                       After obtaining informed consent, the colonoscope was                        passed under direct vision. Throughout the procedure,  the patient's blood pressure, pulse, and oxygen                        saturations were monitored continuously. The Olympus                        Colonoscope 190 [id number] was introduced through the                        anus and advanced to the the cecum, identified by                        appendiceal orifice and ileocecal valve. The                        colonoscopy was performed without difficulty. The                        patient tolerated the procedure well. The quality of                        the bowel preparation was excellent. Findings:      The perianal and digital rectal examinations were normal.      A 4 mm polyp was found in the ascending colon. The polyp was sessile.       The polyp was removed with a cold snare. Resection and retrieval were       complete.      A 5 mm polyp was found in the transverse colon. The polyp was sessile.       The polyp was removed with a cold snare. Resection and retrieval were       complete.      A 3 mm polyp was found in the transverse colon. The polyp was sessile.       The polyp was removed with a cold biopsy forceps. Resection and       retrieval were complete.      Non-bleeding internal hemorrhoids were found during retroflexion. The       hemorrhoids were Grade I (internal hemorrhoids that do not prolapse). Impression:           - One 4 mm polyp in the ascending colon, removed with a                        cold snare. Resected and retrieved.                       - One 5 mm polyp in the transverse colon, removed with                        a cold snare. Resected and retrieved.                       - One 3 mm polyp in the transverse colon, removed with                        a cold biopsy forceps. Resected and retrieved.                       - Non-bleeding internal hemorrhoids. Recommendation:       -  Discharge patient to home.                       - Resume previous diet.                       - Continue present medications.                       - Await pathology results.                       - Repeat colonoscopy in 5 years if polyp adenoma and 10                        years if hyperplastic Procedure Code(s):    ---  Professional ---                       [id number], Colonoscopy, flexible; with removal of tumor(s),                        polyp(s), or other lesion(s) by snare technique                       45380, 23, Colonoscopy, flexible; with biopsy, single                        or multiple Diagnosis Code(s):    --- Professional ---                       Z12.11, Encounter for screening for malignant neoplasm                        of colon                       D12.2, Benign neoplasm of ascending colon                       D12.3, Benign neoplasm of transverse colon (hepatic                        flexure or splenic flexure) CPT copyright 2016 American Medical Association. All rights reserved. The codes documented in this report are preliminary and upon coder review may  be revised to meet current compliance requirements. Lucilla Lame MD, MD 02/12/2017 11:12:42 AM This report has been signed electronically. Number of Addenda: 0 Note Initiated On: 02/12/2017 10:50 AM Scope Withdrawal Time: 0 hours 9 minutes 58 seconds  Total Procedure Duration: 0 hours 12 minutes 11 seconds       [facility]

## 2017-02-12 NOTE — H&P (Signed)
Lucilla Lame, MD [address] [place], [place] [address] Phone: [phone] Fax : [phone]  Primary Care Physician:  Juline Patch, MD Primary Gastroenterologist:  Dr. Allen Norris  Pre-Procedure History & Physical: HPI:  Melvin WHYTE Sr. is a 57 y.o. male is here for a screening colonoscopy.   Past Medical History:  Diagnosis Date  . AK (actinic keratosis)   . Diabetes mellitus without complication ([place])   . History of TIA (transient ischemic attack)    over 20 yrs ago. no deficits.  . Hypercholesteremia   . Hyperlipidemia   . Hypertension   . Kidney stones   . WPW (Wolff-Parkinson-White syndrome)    cardio note 07/11/12    Past Surgical History:  Procedure Laterality Date  . CARDIAC CATHETERIZATION     over 20 yrs ago.  no issues per pt.  Marland Kitchen FOOT SURGERY    . HERNIA REPAIR     x2  . SPLENECTOMY      Prior to Admission medications   Medication Sig Start Date End Date Taking? Authorizing Provider  aspirin 81 MG tablet Take 81 mg by mouth daily.   Yes [provider]  atorvastatin (LIPITOR) 40 MG tablet Take 1 tablet (40 mg total) by mouth daily. 01/07/17  Yes Juline Patch, MD  glipiZIDE (GLUCOTROL) 10 MG tablet Take 1 tablet (10 mg total) by mouth 2 (two) times daily before a meal. 01/07/17  Yes Juline Patch, MD  lisinopril (PRINIVIL,ZESTRIL) 20 MG tablet Take 1 tablet (20 mg total) by mouth daily. 01/07/17  Yes Juline Patch, MD  metFORMIN (GLUCOPHAGE) 1000 MG tablet Take 1 tablet (1,000 mg total) by mouth 2 (two) times daily. 01/07/17  Yes Juline Patch, MD  tamsulosin (FLOMAX) 0.4 MG CAPS capsule Take 1 capsule (0.4 mg total) by mouth daily after breakfast. 01/07/17  Yes Juline Patch, MD  blood glucose meter kit and supplies Dispense based on patient and insurance preference. Use up to four times daily as directed. (FOR ICD-9 250.00, 250.01). 04/12/15   Adline Potter, MD    Allergies as of 01/28/2017  . (No Known Allergies)     Family History  Problem Relation Age of Onset  . Heart disease Mother   . Heart attack Father   . Heart disease Father     Social History   Socioeconomic History  . Marital status: Married    Spouse name: Not on file  . Number of children: Not on file  . Years of education: Not on file  . Highest education level: Not on file  Social Needs  . Financial resource strain: Not on file  . Food insecurity - worry: Not on file  . Food insecurity - inability: Not on file  . Transportation needs - medical: Not on file  . Transportation needs - non-medical: Not on file  Occupational History  . Not on file  Tobacco Use  . Smoking status: Former Smoker    Types: Cigarettes    Last attempt to quit: 1997    Years since quitting: 22.0  . Smokeless tobacco: Never Used  Substance and Sexual Activity  . Alcohol use: No    Alcohol/week: 0.0 oz  . Drug use: No  . Sexual activity: Not on file  Other Topics Concern  . Not on file  Social History Narrative  . Not on file    Review of Systems: See HPI, otherwise negative ROS  Physical Exam: BP 134/88   Pulse  96   Temp 98.1 F (36.7 C) (Temporal)   Ht '5\' 7"'  (1.702 m)   Wt 174 lb (78.9 kg)   SpO2 97%   BMI 27.25 kg/m  General:   Alert,  pleasant and cooperative in NAD Head:  Normocephalic and atraumatic. Neck:  Supple; no masses or thyromegaly. Lungs:  Clear throughout to auscultation.    Heart:  Regular rate and rhythm. Abdomen:  Soft, nontender and nondistended. Normal bowel sounds, without guarding, and without rebound.   Neurologic:  Alert and  oriented x4;  grossly normal neurologically.  Impression/Plan: Melvin Delaine Sr. is now here to undergo a screening colonoscopy.  Risks, benefits, and alternatives regarding colonoscopy have been reviewed with the patient.  Questions have been answered.  All parties agreeable.

## 2017-02-12 NOTE — Anesthesia Postprocedure Evaluation (Signed)
Anesthesia Post Note  Patient: Melvin THELIN Sr.  Procedure(s) Performed: COLONOSCOPY WITH PROPOFOL (N/A ) POLYPECTOMY  Patient location during evaluation: PACU Anesthesia Type: General Level of consciousness: awake and alert Pain management: pain level controlled Vital Signs Assessment: post-procedure vital signs reviewed and stable Respiratory status: spontaneous breathing Cardiovascular status: blood pressure returned to baseline Postop Assessment: no headache Anesthetic complications: no    Jaci Standard, III,  Jeray Shugart D

## 2017-02-16 ENCOUNTER — Encounter: Payer: Self-pay | Admitting: Gastroenterology

## 2017-02-17 ENCOUNTER — Encounter: Payer: Self-pay | Admitting: Gastroenterology

## 2017-02-18 ENCOUNTER — Ambulatory Visit: Payer: BLUE CROSS/BLUE SHIELD | Admitting: Family Medicine

## 2017-02-19 ENCOUNTER — Encounter: Payer: Self-pay | Admitting: Family Medicine

## 2017-02-19 ENCOUNTER — Ambulatory Visit (INDEPENDENT_AMBULATORY_CARE_PROVIDER_SITE_OTHER): Payer: BLUE CROSS/BLUE SHIELD | Admitting: Family Medicine

## 2017-02-19 VITALS — BP 138/80 | HR 80 | Ht 67.0 in | Wt 181.0 lb

## 2017-02-19 DIAGNOSIS — E1140 Type 2 diabetes mellitus with diabetic neuropathy, unspecified: Secondary | ICD-10-CM

## 2017-02-19 MED ORDER — METFORMIN HCL 1000 MG PO TABS: 1000.0000 mg | ORAL_TABLET | Freq: Two times a day (BID) | ORAL | 1 refills | Status: DC

## 2017-02-19 MED ORDER — GLIPIZIDE 10 MG PO TABS: 10.0000 mg | ORAL_TABLET | Freq: Every day | ORAL | 1 refills | Status: DC

## 2017-02-19 MED ORDER — GLIPIZIDE 5 MG PO TABS: 5.0000 mg | ORAL_TABLET | Freq: Every day | ORAL | 1 refills | Status: DC

## 2017-02-19 NOTE — Progress Notes (Signed)
a1cName: Melvin Delaine Sr.   MRN: [id number]    DOB: [birth year]   Date:02/19/2017       Progress Note  Subjective  Chief Complaint  Chief Complaint  Patient presents with  . Diabetes    wanted to recheck A1C this month    Diabetes  He presents for his follow-up diabetic visit. He has type 2 diabetes mellitus. His disease course has been stable. There are no hypoglycemic associated symptoms. Pertinent negatives for hypoglycemia include no dizziness, headaches or nervousness/anxiousness. Pertinent negatives for diabetes include no blurred vision, no chest pain, no fatigue, no foot paresthesias, no foot ulcerations, no polydipsia, no polyphagia, no polyuria, no visual change, no weakness and no weight loss. There are no hypoglycemic complications. Symptoms are stable. There are no diabetic complications. Risk factors for coronary artery disease include diabetes mellitus and dyslipidemia. Current diabetic treatment includes oral agent (dual therapy). He is compliant with treatment most of the time. His weight is decreasing steadily. He is following a generally healthy diet. He participates in exercise intermittently. His home blood glucose trend is fluctuating minimally. His breakfast blood glucose is taken between 8-9 am. His breakfast blood glucose range is generally 140-180 mg/dl.    No problem-specific Assessment & Plan notes found for this encounter.   Past Medical History:  Diagnosis Date  . AK (actinic keratosis)   . Diabetes mellitus without complication ([place])   . History of TIA (transient ischemic attack)    over 20 yrs ago. no deficits.  . Hypercholesteremia   . Hyperlipidemia   . Hypertension   . Kidney stones   . WPW (Wolff-Parkinson-White syndrome)    cardio note 07/11/12    Past Surgical History:  Procedure Laterality Date  . CARDIAC CATHETERIZATION     over 20 yrs ago.  no issues per pt.  . COLONOSCOPY WITH PROPOFOL N/A 02/12/2017   Procedure: COLONOSCOPY WITH PROPOFOL;   Surgeon: Lucilla Lame, MD;  Location: [place];  Service: Endoscopy;  Laterality: N/A;  Diabetic - oral meds  . FOOT SURGERY    . HERNIA REPAIR     x2  . POLYPECTOMY  02/12/2017   Procedure: POLYPECTOMY;  Surgeon: Lucilla Lame, MD;  Location: [place];  Service: Endoscopy;;  . SPLENECTOMY      Family History  Problem Relation Age of Onset  . Heart disease Mother   . Heart attack Father   . Heart disease Father     Social History   Socioeconomic History  . Marital status: Married    Spouse name: Not on file  . Number of children: Not on file  . Years of education: Not on file  . Highest education level: Not on file  Social Needs  . Financial resource strain: Not on file  . Food insecurity - worry: Not on file  . Food insecurity - inability: Not on file  . Transportation needs - medical: Not on file  . Transportation needs - non-medical: Not on file  Occupational History  . Not on file  Tobacco Use  . Smoking status: Former Smoker    Types: Cigarettes    Last attempt to quit: 1997    Years since quitting: 22.1  . Smokeless tobacco: Never Used  Substance and Sexual Activity  . Alcohol use: No    Alcohol/week: 0.0 oz  . Drug use: No  . Sexual activity: Not on file  Other Topics Concern  . Not on file  Social History Narrative  . Not  on file    No Known Allergies  Outpatient Medications Prior to Visit  Medication Sig Dispense Refill  . aspirin 81 MG tablet Take 81 mg by mouth daily.    Marland Kitchen atorvastatin (LIPITOR) 40 MG tablet Take 1 tablet (40 mg total) by mouth daily. 90 tablet 1  . blood glucose meter kit and supplies Dispense based on patient and insurance preference. Use up to four times daily as directed. (FOR ICD-9 250.00, 250.01). 1 each 0  . lisinopril (PRINIVIL,ZESTRIL) 20 MG tablet Take 1 tablet (20 mg total) by mouth daily. 90 tablet 1  . tamsulosin (FLOMAX) 0.4 MG CAPS capsule Take 1 capsule (0.4 mg total) by mouth daily after  breakfast. 90 capsule 1  . glipiZIDE (GLUCOTROL) 10 MG tablet Take 1 tablet (10 mg total) by mouth 2 (two) times daily before a meal. 180 tablet 1  . metFORMIN (GLUCOPHAGE) 1000 MG tablet Take 1 tablet (1,000 mg total) by mouth 2 (two) times daily. 180 tablet 1   No facility-administered medications prior to visit.     Review of Systems  Constitutional: Negative for chills, fatigue, fever, malaise/fatigue and weight loss.  HENT: Negative for ear discharge, ear pain and sore throat.   Eyes: Negative for blurred vision.  Respiratory: Negative for cough, sputum production, shortness of breath and wheezing.   Cardiovascular: Negative for chest pain, palpitations and leg swelling.  Gastrointestinal: Negative for abdominal pain, blood in stool, constipation, diarrhea, heartburn, melena and nausea.  Genitourinary: Negative for dysuria, frequency, hematuria and urgency.  Musculoskeletal: Negative for back pain, joint pain, myalgias and neck pain.  Skin: Negative for rash.  Neurological: Negative for dizziness, tingling, sensory change, focal weakness, weakness and headaches.  Endo/Heme/Allergies: Negative for environmental allergies, polydipsia and polyphagia. Does not bruise/bleed easily.  Psychiatric/Behavioral: Negative for depression and suicidal ideas. The patient is not nervous/anxious and does not have insomnia.      Objective  Vitals:   02/19/17 1328  BP: 138/80  Pulse: 80  Weight: 181 lb (82.1 kg)  Height: 5' 7" (1.702 m)    Physical Exam  Constitutional: He is oriented to person, place, and time and well-developed, well-nourished, and in no distress.  HENT:  Head: Normocephalic.  Right Ear: External ear normal.  Left Ear: External ear normal.  Nose: Nose normal.  Mouth/Throat: Oropharynx is clear and moist.  Eyes: Conjunctivae and EOM are normal. Pupils are equal, round, and reactive to light. Right eye exhibits no discharge. Left eye exhibits no discharge. No scleral  icterus.  Neck: Normal range of motion. Neck supple. No JVD present. No tracheal deviation present. No thyromegaly present.  Cardiovascular: Normal rate, regular rhythm, normal heart sounds and intact distal pulses. Exam reveals no gallop and no friction rub.  No murmur heard. Pulmonary/Chest: Breath sounds normal. No respiratory distress. He has no wheezes. He has no rales.  Abdominal: Soft. Bowel sounds are normal. He exhibits no mass. There is no hepatosplenomegaly. There is no tenderness. There is no rebound, no guarding and no CVA tenderness.  Musculoskeletal: Normal range of motion. He exhibits no edema or tenderness.  Lymphadenopathy:    He has no cervical adenopathy.  Neurological: He is alert and oriented to person, place, and time. He has normal sensation, normal strength, normal reflexes and intact cranial nerves. No cranial nerve deficit.  Skin: Skin is warm. No rash noted.  Psychiatric: Mood and affect normal.  Nursing note and vitals reviewed.     Assessment & Plan  Problem List Items  Addressed This Visit      Endocrine   Type 2 diabetes mellitus with diabetic neuropathy (HCC) - Primary   Relevant Medications   metFORMIN (GLUCOPHAGE) 1000 MG tablet   glipiZIDE (GLUCOTROL) 10 MG tablet   glipiZIDE (GLUCOTROL) 5 MG tablet   Other Relevant Orders   Hemoglobin A1c      Meds ordered this encounter  Medications  . metFORMIN (GLUCOPHAGE) 1000 MG tablet    Sig: Take 1 tablet (1,000 mg total) by mouth 2 (two) times daily.    Dispense:  180 tablet    Refill:  1  . glipiZIDE (GLUCOTROL) 10 MG tablet    Sig: Take 1 tablet (10 mg total) by mouth daily before breakfast.    Dispense:  90 tablet    Refill:  1  . glipiZIDE (GLUCOTROL) 5 MG tablet    Sig: Take 1 tablet (5 mg total) by mouth at bedtime.    Dispense:  90 tablet    Refill:  1      Dr. Otilio Miu [facility]  02/19/17

## 2017-02-20 LAB — HEMOGLOBIN A1C
Est. average glucose Bld gHb Est-mCnc: 189 mg/dL
HEMOGLOBIN A1C: 8.2 % — AB (ref 4.8–5.6)

## 2017-03-17 ENCOUNTER — Other Ambulatory Visit: Payer: Self-pay | Admitting: Family Medicine

## 2017-04-02 ENCOUNTER — Ambulatory Visit (INDEPENDENT_AMBULATORY_CARE_PROVIDER_SITE_OTHER): Payer: BLUE CROSS/BLUE SHIELD | Admitting: Family Medicine

## 2017-04-02 ENCOUNTER — Encounter: Payer: Self-pay | Admitting: Family Medicine

## 2017-04-02 VITALS — BP 138/92 | HR 80 | Ht 67.0 in | Wt 183.0 lb

## 2017-04-02 DIAGNOSIS — I10 Essential (primary) hypertension: Secondary | ICD-10-CM

## 2017-04-02 DIAGNOSIS — E1140 Type 2 diabetes mellitus with diabetic neuropathy, unspecified: Secondary | ICD-10-CM

## 2017-04-02 DIAGNOSIS — E782 Mixed hyperlipidemia: Secondary | ICD-10-CM | POA: Diagnosis not present

## 2017-04-02 MED ORDER — ATORVASTATIN CALCIUM 40 MG PO TABS: 40.0000 mg | ORAL_TABLET | Freq: Every day | ORAL | 1 refills | Status: DC

## 2017-04-02 MED ORDER — METFORMIN HCL 1000 MG PO TABS: 1000.0000 mg | ORAL_TABLET | Freq: Two times a day (BID) | ORAL | 1 refills | Status: DC

## 2017-04-02 MED ORDER — GLIPIZIDE 10 MG PO TABS: 10.0000 mg | ORAL_TABLET | Freq: Every day | ORAL | 1 refills | Status: DC

## 2017-04-02 MED ORDER — LISINOPRIL 20 MG PO TABS: 20.0000 mg | ORAL_TABLET | Freq: Every day | ORAL | 1 refills | Status: DC

## 2017-04-02 NOTE — Progress Notes (Signed)
Name: Melvin KOUDELKA Sr.   MRN: [id number]    DOB: December 29, 1960   Date:04/02/2017       Progress Note  Subjective  Chief Complaint  Chief Complaint  Patient presents with  . Diabetes    sugar tends to fall in the middle of night- waking up shaky   . Elbow Pain    Diabetes  He presents for his follow-up diabetic visit. He has type 2 diabetes mellitus. His disease course has been stable. There are no hypoglycemic associated symptoms. Pertinent negatives for hypoglycemia include no dizziness, headaches or nervousness/anxiousness. Pertinent negatives for diabetes include no blurred vision, no chest pain, no fatigue, no foot paresthesias, no foot ulcerations, no polydipsia, no polyphagia, no polyuria, no visual change, no weakness and no weight loss. There are no hypoglycemic complications. Symptoms are improving. There are no diabetic complications. He is compliant with treatment some of the time. He is following a generally healthy diet. Meal planning includes avoidance of concentrated sweets. He participates in exercise intermittently. His breakfast blood glucose is taken between 8-9 am. His breakfast blood glucose range is generally 110-130 mg/dl. His bedtime blood glucose is taken after 11 pm. His bedtime blood glucose range is generally <70 mg/dl. An ACE inhibitor/angiotensin II receptor blocker is being taken. He does not see a podiatrist.Eye exam is not current.    No problem-specific Assessment & Plan notes found for this encounter.   Past Medical History:  Diagnosis Date  . AK (actinic keratosis)   . Diabetes mellitus without complication ([place])   . History of TIA (transient ischemic attack)    over 20 yrs ago. no deficits.  . Hypercholesteremia   . Hyperlipidemia   . Hypertension   . Kidney stones   . WPW (Wolff-Parkinson-White syndrome)    cardio note 07/11/12    Past Surgical History:  Procedure Laterality Date  . CARDIAC CATHETERIZATION     over 20 yrs ago.  no issues per  pt.  . COLONOSCOPY WITH PROPOFOL N/A 02/12/2017   Procedure: COLONOSCOPY WITH PROPOFOL;  Surgeon: Lucilla Lame, MD;  Location: [place];  Service: Endoscopy;  Laterality: N/A;  Diabetic - oral meds  . FOOT SURGERY    . HERNIA REPAIR     x2  . POLYPECTOMY  02/12/2017   Procedure: POLYPECTOMY;  Surgeon: Lucilla Lame, MD;  Location: [place];  Service: Endoscopy;;  . SPLENECTOMY      Family History  Problem Relation Age of Onset  . Heart disease Mother   . Heart attack Father   . Heart disease Father     Social History   Socioeconomic History  . Marital status: Married    Spouse name: Not on file  . Number of children: Not on file  . Years of education: Not on file  . Highest education level: Not on file  Occupational History  . Not on file  Social Needs  . Financial resource strain: Not on file  . Food insecurity:    Worry: Not on file    Inability: Not on file  . Transportation needs:    Medical: Not on file    Non-medical: Not on file  Tobacco Use  . Smoking status: Former Smoker    Types: Cigarettes    Last attempt to quit: 1997    Years since quitting: 22.2  . Smokeless tobacco: Never Used  Substance and Sexual Activity  . Alcohol use: No    Alcohol/week: 0.0 oz  . Drug use: No  .  Sexual activity: Not on file  Lifestyle  . Physical activity:    Days per week: Not on file    Minutes per session: Not on file  . Stress: Not on file  Relationships  . Social connections:    Talks on phone: Not on file    Gets together: Not on file    Attends religious service: Not on file    Active member of club or organization: Not on file    Attends meetings of clubs or organizations: Not on file    Relationship status: Not on file  . Intimate partner violence:    Fear of current or ex partner: Not on file    Emotionally abused: Not on file    Physically abused: Not on file    Forced sexual activity: Not on file  Other Topics Concern  . Not on file   Social History Narrative  . Not on file    No Known Allergies  Outpatient Medications Prior to Visit  Medication Sig Dispense Refill  . aspirin 81 MG tablet Take 81 mg by mouth daily.    Marland Kitchen BAYER CONTOUR TEST test strip USE ONE STRIP TO CHECK GLUCOSE ONCE DAILY 100 each 0  . blood glucose meter kit and supplies Dispense based on patient and insurance preference. Use up to four times daily as directed. (FOR ICD-9 250.00, 250.01). 1 each 0  . MICROLET LANCETS MISC USE ONE  TO CHECK GLUCOSE ONCE DAILY 100 each 1  . tamsulosin (FLOMAX) 0.4 MG CAPS capsule Take 1 capsule (0.4 mg total) by mouth daily after breakfast. 90 capsule 1  . atorvastatin (LIPITOR) 40 MG tablet Take 1 tablet (40 mg total) by mouth daily. 90 tablet 1  . glipiZIDE (GLUCOTROL) 10 MG tablet Take 1 tablet (10 mg total) by mouth daily before breakfast. 90 tablet 1  . glipiZIDE (GLUCOTROL) 5 MG tablet Take 1 tablet (5 mg total) by mouth at bedtime. 90 tablet 1  . lisinopril (PRINIVIL,ZESTRIL) 20 MG tablet Take 1 tablet (20 mg total) by mouth daily. 90 tablet 1  . metFORMIN (GLUCOPHAGE) 1000 MG tablet Take 1 tablet (1,000 mg total) by mouth 2 (two) times daily. 180 tablet 1   No facility-administered medications prior to visit.     Review of Systems  Constitutional: Negative for chills, fatigue, fever, malaise/fatigue and weight loss.  HENT: Negative for ear discharge, ear pain and sore throat.   Eyes: Negative for blurred vision.  Respiratory: Negative for cough, sputum production, shortness of breath and wheezing.   Cardiovascular: Negative for chest pain, palpitations and leg swelling.  Gastrointestinal: Negative for abdominal pain, blood in stool, constipation, diarrhea, heartburn, melena and nausea.  Genitourinary: Negative for dysuria, frequency, hematuria and urgency.  Musculoskeletal: Negative for back pain, joint pain, myalgias and neck pain.  Skin: Negative for rash.  Neurological: Negative for dizziness,  tingling, sensory change, focal weakness, weakness and headaches.  Endo/Heme/Allergies: Negative for environmental allergies, polydipsia and polyphagia. Does not bruise/bleed easily.  Psychiatric/Behavioral: Negative for depression and suicidal ideas. The patient is not nervous/anxious and does not have insomnia.      Objective  Vitals:   04/02/17 1327  BP: (!) 138/92  Pulse: 80  Weight: 183 lb (83 kg)  Height: '5\' 7"'  (1.702 m)    Physical Exam  Constitutional: He is oriented to person, place, and time and well-developed, well-nourished, and in no distress.  HENT:  Head: Normocephalic.  Right Ear: External ear normal.  Left Ear: External ear  normal.  Nose: Nose normal.  Mouth/Throat: Oropharynx is clear and moist.  Eyes: Pupils are equal, round, and reactive to light. Conjunctivae and EOM are normal. Right eye exhibits no discharge. Left eye exhibits no discharge. No scleral icterus.  Neck: Normal range of motion. Neck supple. No JVD present. No tracheal deviation present. No thyromegaly present.  Cardiovascular: Normal rate, regular rhythm, normal heart sounds and intact distal pulses. Exam reveals no gallop and no friction rub.  No murmur heard. Pulmonary/Chest: Breath sounds normal. No respiratory distress. He has no wheezes. He has no rales.  Abdominal: Soft. Bowel sounds are normal. He exhibits no mass. There is no hepatosplenomegaly. There is no tenderness. There is no rebound, no guarding and no CVA tenderness.  Musculoskeletal: Normal range of motion. He exhibits no edema or tenderness.  Lymphadenopathy:    He has no cervical adenopathy.  Neurological: He is alert and oriented to person, place, and time. He has normal sensation, normal strength and intact cranial nerves. No cranial nerve deficit.  Skin: Skin is warm. No rash noted.  Psychiatric: Mood and affect normal.  Nursing note and vitals reviewed.     Assessment & Plan  Problem List Items Addressed This Visit       Cardiovascular and Mediastinum   HTN (hypertension)   Relevant Medications   atorvastatin (LIPITOR) 40 MG tablet   lisinopril (PRINIVIL,ZESTRIL) 20 MG tablet     Endocrine   Type 2 diabetes mellitus with diabetic neuropathy (HCC) - Primary   Relevant Medications   atorvastatin (LIPITOR) 40 MG tablet   glipiZIDE (GLUCOTROL) 10 MG tablet   lisinopril (PRINIVIL,ZESTRIL) 20 MG tablet   metFORMIN (GLUCOPHAGE) 1000 MG tablet   Other Relevant Orders   Hemoglobin A1c     Other   HLD (hyperlipidemia)   Relevant Medications   atorvastatin (LIPITOR) 40 MG tablet   lisinopril (PRINIVIL,ZESTRIL) 20 MG tablet      Meds ordered this encounter  Medications  . atorvastatin (LIPITOR) 40 MG tablet    Sig: Take 1 tablet (40 mg total) by mouth daily.    Dispense:  90 tablet    Refill:  1  . glipiZIDE (GLUCOTROL) 10 MG tablet    Sig: Take 1 tablet (10 mg total) by mouth daily before breakfast.    Dispense:  90 tablet    Refill:  1  . lisinopril (PRINIVIL,ZESTRIL) 20 MG tablet    Sig: Take 1 tablet (20 mg total) by mouth daily.    Dispense:  90 tablet    Refill:  1  . metFORMIN (GLUCOPHAGE) 1000 MG tablet    Sig: Take 1 tablet (1,000 mg total) by mouth 2 (two) times daily.    Dispense:  180 tablet    Refill:  1      Dr. Otilio Miu [facility]  04/02/17

## 2017-04-03 LAB — HEMOGLOBIN A1C
Est. average glucose Bld gHb Est-mCnc: 183 mg/dL
Hgb A1c MFr Bld: 8 % — ABNORMAL HIGH (ref 4.8–5.6)

## 2017-04-05 ENCOUNTER — Ambulatory Visit: Payer: BLUE CROSS/BLUE SHIELD | Admitting: Family Medicine

## 2017-05-10 LAB — HEMOGLOBIN A1C: Hemoglobin A1C: 6.9

## 2017-06-21 ENCOUNTER — Ambulatory Visit (INDEPENDENT_AMBULATORY_CARE_PROVIDER_SITE_OTHER): Payer: BLUE CROSS/BLUE SHIELD | Admitting: Family Medicine

## 2017-06-21 ENCOUNTER — Encounter: Payer: Self-pay | Admitting: Family Medicine

## 2017-06-21 DIAGNOSIS — I10 Essential (primary) hypertension: Secondary | ICD-10-CM | POA: Diagnosis not present

## 2017-06-21 DIAGNOSIS — E782 Mixed hyperlipidemia: Secondary | ICD-10-CM

## 2017-06-21 DIAGNOSIS — E1140 Type 2 diabetes mellitus with diabetic neuropathy, unspecified: Secondary | ICD-10-CM | POA: Diagnosis not present

## 2017-06-21 DIAGNOSIS — N400 Enlarged prostate without lower urinary tract symptoms: Secondary | ICD-10-CM | POA: Diagnosis not present

## 2017-06-21 MED ORDER — GLIPIZIDE 10 MG PO TABS: 10.0000 mg | ORAL_TABLET | Freq: Every day | ORAL | 1 refills | Status: DC

## 2017-06-21 MED ORDER — METFORMIN HCL 1000 MG PO TABS: 1000.0000 mg | ORAL_TABLET | Freq: Two times a day (BID) | ORAL | 1 refills | Status: DC

## 2017-06-21 MED ORDER — LISINOPRIL 20 MG PO TABS: 20.0000 mg | ORAL_TABLET | Freq: Every day | ORAL | 1 refills | Status: DC

## 2017-06-21 MED ORDER — TAMSULOSIN HCL 0.4 MG PO CAPS: 0.4000 mg | ORAL_CAPSULE | Freq: Every day | ORAL | 1 refills | Status: DC

## 2017-06-21 MED ORDER — ATORVASTATIN CALCIUM 40 MG PO TABS: 40.0000 mg | ORAL_TABLET | Freq: Every day | ORAL | 1 refills | Status: DC

## 2017-06-21 NOTE — Assessment & Plan Note (Signed)
Controlled on atorvastatin 40 mg q day and will check lipid panel to evaluate lipid status.

## 2017-06-21 NOTE — Assessment & Plan Note (Signed)
Controlled on lisinopril 20 mg q day. Will evaluate gfr with renal pael.

## 2017-06-21 NOTE — Assessment & Plan Note (Addendum)
Currently treated with metformen 1 gm bid and glipizide 10 mg q day. Will check A1c to evaluate control.

## 2017-06-21 NOTE — Progress Notes (Signed)
Name: Melvin STICK Sr.   MRN: [id number]    DOB: [birth year]   Date:06/21/2017       Progress Note  Subjective  Chief Complaint  Chief Complaint  Patient presents with  . Hypertension  . Diabetes    had A1C on 4/29= 6.9    Hypertension  This is a chronic problem. The current episode started more than 1 year ago. The problem is unchanged. The problem is controlled. Pertinent negatives include no anxiety, blurred vision, chest pain, headaches, malaise/fatigue, neck pain, orthopnea, palpitations, peripheral edema, PND, shortness of breath or sweats. There are no associated agents to hypertension. Risk factors for coronary artery disease include diabetes mellitus and dyslipidemia. Past treatments include ACE inhibitors. The current treatment provides moderate improvement. There are no compliance problems.  There is no history of angina, kidney disease, CAD/MI, CVA, heart failure, left ventricular hypertrophy or retinopathy. There is no history of chronic renal disease, a hypertension causing med or renovascular disease.  Diabetes  He presents for his follow-up diabetic visit. He has type 2 diabetes mellitus. His disease course has been stable. Pertinent negatives for hypoglycemia include no confusion, dizziness, headaches, hunger, mood changes, nervousness/anxiousness, pallor, seizures, sleepiness, speech difficulty, sweats or tremors. Pertinent negatives for diabetes include no blurred vision, no chest pain, no polydipsia and no weight loss. There are no hypoglycemic complications. There are no diabetic complications. Pertinent negatives for diabetic complications include no CVA or retinopathy. Risk factors for coronary artery disease include diabetes mellitus, dyslipidemia, hypertension and male sex. Current diabetic treatment includes oral agent (dual therapy). He is compliant with treatment some of the time. His weight is stable. He is following a generally healthy diet. His breakfast blood glucose  is taken between 8-9 am. His breakfast blood glucose range is generally 130-140 mg/dl. An ACE inhibitor/angiotensin II receptor blocker is being taken. He does not see a podiatrist.Eye exam is not current.  Hyperlipidemia  This is a chronic problem. The current episode started more than 1 year ago. The problem is controlled. Recent lipid tests were reviewed and are normal. He has no history of chronic renal disease, diabetes, hypothyroidism, liver disease, obesity or nephrotic syndrome. Factors aggravating his hyperlipidemia include thiazides. Pertinent negatives include no chest pain, focal sensory loss, focal weakness, leg pain, myalgias or shortness of breath. Current antihyperlipidemic treatment includes statins. The current treatment provides moderate improvement of lipids. There are no compliance problems.  Risk factors for coronary artery disease include diabetes mellitus, dyslipidemia and hypertension.  Benign Prostatic Hypertrophy  This is a new problem. The current episode started more than 1 year ago. The problem has been gradually improving since onset. Irritative symptoms do not include frequency, nocturia or urgency. improved nocturia on tamulosin. Obstructive symptoms do not include an intermittent stream or a slower stream. Pertinent negatives include no chills, dysuria, genital pain, hematuria, hesitancy, nausea or vomiting. Nothing aggravates the symptoms. Past treatments include tamsulosin. The treatment provided moderate relief.    HTN (hypertension) Controlled on lisinopril 20 mg q day. Will evaluate gfr with renal pael.  Type 2 diabetes mellitus with diabetic neuropathy Currently treated with metformen 1 gm bid and glipizide 10 mg q day. Will check A1c to evaluate control.  HLD (hyperlipidemia) Controlled on atorvastatin 40 mg q day and will check lipid panel to evaluate lipid status.   Past Medical History:  Diagnosis Date  . AK (actinic keratosis)   . Diabetes mellitus  without complication ([place])   . History of TIA (transient ischemic  attack)    over 20 yrs ago. no deficits.  . Hypercholesteremia   . Hyperlipidemia   . Hypertension   . Kidney stones   . WPW (Wolff-Parkinson-White syndrome)    cardio note 07/11/12    Past Surgical History:  Procedure Laterality Date  . CARDIAC CATHETERIZATION     over 20 yrs ago.  no issues per pt.  . COLONOSCOPY WITH PROPOFOL N/A 02/12/2017   Procedure: COLONOSCOPY WITH PROPOFOL;  Surgeon: Lucilla Lame, MD;  Location: [place];  Service: Endoscopy;  Laterality: N/A;  Diabetic - oral meds  . FOOT SURGERY    . HERNIA REPAIR     x2  . POLYPECTOMY  02/12/2017   Procedure: POLYPECTOMY;  Surgeon: Lucilla Lame, MD;  Location: [place];  Service: Endoscopy;;  . SPLENECTOMY      Family History  Problem Relation Age of Onset  . Heart disease Mother   . Heart attack Father   . Heart disease Father     Social History   Socioeconomic History  . Marital status: Married    Spouse name: Not on file  . Number of children: Not on file  . Years of education: Not on file  . Highest education level: Not on file  Occupational History  . Not on file  Social Needs  . Financial resource strain: Not on file  . Food insecurity:    Worry: Not on file    Inability: Not on file  . Transportation needs:    Medical: Not on file    Non-medical: Not on file  Tobacco Use  . Smoking status: Former Smoker    Types: Cigarettes    Last attempt to quit: 1997    Years since quitting: 22.4  . Smokeless tobacco: Never Used  Substance and Sexual Activity  . Alcohol use: No    Alcohol/week: 0.0 oz  . Drug use: No  . Sexual activity: Not on file  Lifestyle  . Physical activity:    Days per week: Not on file    Minutes per session: Not on file  . Stress: Not on file  Relationships  . Social connections:    Talks on phone: Not on file    Gets together: Not on file    Attends religious service: Not on file     Active member of club or organization: Not on file    Attends meetings of clubs or organizations: Not on file    Relationship status: Not on file  . Intimate partner violence:    Fear of current or ex partner: Not on file    Emotionally abused: Not on file    Physically abused: Not on file    Forced sexual activity: Not on file  Other Topics Concern  . Not on file  Social History Narrative  . Not on file    No Known Allergies  Outpatient Medications Prior to Visit  Medication Sig Dispense Refill  . aspirin 81 MG tablet Take 81 mg by mouth daily.    Marland Kitchen BAYER CONTOUR TEST test strip USE ONE STRIP TO CHECK GLUCOSE ONCE DAILY 100 each 0  . blood glucose meter kit and supplies Dispense based on patient and insurance preference. Use up to four times daily as directed. (FOR ICD-9 250.00, 250.01). 1 each 0  . MICROLET LANCETS MISC USE ONE  TO CHECK GLUCOSE ONCE DAILY 100 each 1  . atorvastatin (LIPITOR) 40 MG tablet Take 1 tablet (40 mg total) by mouth daily.  90 tablet 1  . glipiZIDE (GLUCOTROL) 10 MG tablet Take 1 tablet (10 mg total) by mouth daily before breakfast. 90 tablet 1  . lisinopril (PRINIVIL,ZESTRIL) 20 MG tablet Take 1 tablet (20 mg total) by mouth daily. 90 tablet 1  . metFORMIN (GLUCOPHAGE) 1000 MG tablet Take 1 tablet (1,000 mg total) by mouth 2 (two) times daily. 180 tablet 1  . tamsulosin (FLOMAX) 0.4 MG CAPS capsule Take 1 capsule (0.4 mg total) by mouth daily after breakfast. 90 capsule 1   No facility-administered medications prior to visit.     Review of Systems  Constitutional: Negative for chills, fever, malaise/fatigue and weight loss.  HENT: Negative for ear discharge, ear pain and sore throat.   Eyes: Negative for blurred vision.  Respiratory: Negative for cough, sputum production, shortness of breath and wheezing.   Cardiovascular: Negative for chest pain, palpitations, orthopnea, leg swelling and PND.  Gastrointestinal: Negative for abdominal pain, blood in  stool, constipation, diarrhea, heartburn, melena, nausea and vomiting.  Genitourinary: Negative for dysuria, frequency, hematuria, hesitancy, nocturia and urgency.  Musculoskeletal: Negative for back pain, joint pain, myalgias and neck pain.  Skin: Negative for pallor and rash.  Neurological: Negative for dizziness, tingling, tremors, sensory change, focal weakness, seizures, speech difficulty and headaches.  Endo/Heme/Allergies: Negative for environmental allergies and polydipsia. Does not bruise/bleed easily.  Psychiatric/Behavioral: Negative for confusion, depression and suicidal ideas. The patient is not nervous/anxious and does not have insomnia.      Objective  Vitals:   06/21/17 0810  BP: 136/82  Pulse: 80  Weight: 180 lb (81.6 kg)  Height: '5\' 7"'  (1.702 m)    Physical Exam  Constitutional: He is oriented to person, place, and time.  HENT:  Head: Normocephalic.  Right Ear: External ear normal.  Left Ear: External ear normal.  Nose: Nose normal.  Mouth/Throat: Oropharynx is clear and moist.  Eyes: Pupils are equal, round, and reactive to light. Conjunctivae and EOM are normal. Right eye exhibits no discharge. Left eye exhibits no discharge. No scleral icterus.  Neck: Normal range of motion. Neck supple. No JVD present. No tracheal deviation present. No thyromegaly present.  Cardiovascular: Normal rate, regular rhythm, normal heart sounds and intact distal pulses. Exam reveals no gallop and no friction rub.  No murmur heard. Pulmonary/Chest: Breath sounds normal. No respiratory distress. He has no wheezes. He has no rales.  Abdominal: Soft. Bowel sounds are normal. He exhibits no mass. There is no hepatosplenomegaly. There is no tenderness. There is no rebound, no guarding and no CVA tenderness.  Musculoskeletal: Normal range of motion. He exhibits no edema or tenderness.  Lymphadenopathy:    He has no cervical adenopathy.  Neurological: He is alert and oriented to person,  place, and time. He has normal strength and normal reflexes. No cranial nerve deficit.  Skin: Skin is warm. No rash noted.  Nursing note and vitals reviewed.     Assessment & Plan  Problem List Items Addressed This Visit      Cardiovascular and Mediastinum   HTN (hypertension)    Controlled on lisinopril 20 mg q day. Will evaluate gfr with renal pael.      Relevant Medications   lisinopril (PRINIVIL,ZESTRIL) 20 MG tablet   atorvastatin (LIPITOR) 40 MG tablet   Other Relevant Orders   Renal Function Panel     Endocrine   Type 2 diabetes mellitus with diabetic neuropathy (HCC)    Currently treated with metformen 1 gm bid and glipizide 10 mg q  day. Will check A1c to evaluate control.      Relevant Medications   lisinopril (PRINIVIL,ZESTRIL) 20 MG tablet   atorvastatin (LIPITOR) 40 MG tablet   metFORMIN (GLUCOPHAGE) 1000 MG tablet   glipiZIDE (GLUCOTROL) 10 MG tablet   Other Relevant Orders   Renal Function Panel     Genitourinary   Benign prostatic hyperplasia without lower urinary tract symptoms   Relevant Medications   tamsulosin (FLOMAX) 0.4 MG CAPS capsule     Other   HLD (hyperlipidemia)    Controlled on atorvastatin 40 mg q day and will check lipid panel to evaluate lipid status.      Relevant Medications   lisinopril (PRINIVIL,ZESTRIL) 20 MG tablet   atorvastatin (LIPITOR) 40 MG tablet   Other Relevant Orders   Lipid panel      Meds ordered this encounter  Medications  . tamsulosin (FLOMAX) 0.4 MG CAPS capsule    Sig: Take 1 capsule (0.4 mg total) by mouth daily after breakfast.    Dispense:  90 capsule    Refill:  1  . lisinopril (PRINIVIL,ZESTRIL) 20 MG tablet    Sig: Take 1 tablet (20 mg total) by mouth daily.    Dispense:  90 tablet    Refill:  1  . atorvastatin (LIPITOR) 40 MG tablet    Sig: Take 1 tablet (40 mg total) by mouth daily.    Dispense:  90 tablet    Refill:  1  . metFORMIN (GLUCOPHAGE) 1000 MG tablet    Sig: Take 1 tablet (1,000  mg total) by mouth 2 (two) times daily.    Dispense:  180 tablet    Refill:  1  . glipiZIDE (GLUCOTROL) 10 MG tablet    Sig: Take 1 tablet (10 mg total) by mouth daily before breakfast.    Dispense:  90 tablet    Refill:  1      Dr. Macon [facility]  06/21/17

## 2017-06-22 LAB — RENAL FUNCTION PANEL
Albumin: 4.4 g/dL (ref 3.5–5.5)
BUN/Creatinine Ratio: 11 (ref 9–20)
BUN: 14 mg/dL (ref 6–24)
CALCIUM: 9.4 mg/dL (ref 8.7–10.2)
CO2: 22 mmol/L (ref 20–29)
CREATININE: 1.22 mg/dL (ref 0.76–1.27)
Chloride: 99 mmol/L (ref 96–106)
GFR calc Af Amer: 76 mL/min/{1.73_m2} (ref >59)
GFR calc non Af Amer: 65 mL/min/{1.73_m2} (ref >59)
Glucose: 118 mg/dL — ABNORMAL HIGH (ref 65–99)
PHOSPHORUS: 3.2 mg/dL (ref 2.5–4.5)
Potassium: 4.8 mmol/L (ref 3.5–5.2)
SODIUM: 139 mmol/L (ref 134–144)

## 2017-06-22 LAB — LIPID PANEL
CHOL/HDL RATIO: 3 ratio (ref 0.0–5.0)
Cholesterol, Total: 143 mg/dL (ref 100–199)
HDL: 47 mg/dL (ref >39)
LDL Calculated: 81 mg/dL (ref 0–99)
Triglycerides: 75 mg/dL (ref 0–149)
VLDL Cholesterol Cal: 15 mg/dL (ref 5–40)

## 2017-06-23 ENCOUNTER — Ambulatory Visit: Payer: BLUE CROSS/BLUE SHIELD | Admitting: Family Medicine

## 2017-07-05 ENCOUNTER — Ambulatory Visit: Payer: BLUE CROSS/BLUE SHIELD | Admitting: Family Medicine

## 2017-09-05 ENCOUNTER — Other Ambulatory Visit: Payer: Self-pay

## 2017-09-05 ENCOUNTER — Emergency Department: Payer: BLUE CROSS/BLUE SHIELD

## 2017-09-05 ENCOUNTER — Emergency Department
Admission: EM | Admit: 2017-09-05 | Discharge: 2017-09-05 | Disposition: A | Discharge status: Home / Self Care | Payer: BLUE CROSS/BLUE SHIELD | Attending: Emergency Medicine | Admitting: Emergency Medicine

## 2017-09-05 ENCOUNTER — Encounter: Payer: Self-pay | Admitting: Emergency Medicine

## 2017-09-05 DIAGNOSIS — Z7982 Long term (current) use of aspirin: Secondary | ICD-10-CM | POA: Insufficient documentation

## 2017-09-05 DIAGNOSIS — Z7984 Long term (current) use of oral hypoglycemic drugs: Secondary | ICD-10-CM | POA: Insufficient documentation

## 2017-09-05 DIAGNOSIS — Z8673 Personal history of transient ischemic attack (TIA), and cerebral infarction without residual deficits: Secondary | ICD-10-CM | POA: Diagnosis not present

## 2017-09-05 DIAGNOSIS — I10 Essential (primary) hypertension: Secondary | ICD-10-CM | POA: Diagnosis not present

## 2017-09-05 DIAGNOSIS — M1712 Unilateral primary osteoarthritis, left knee: Secondary | ICD-10-CM | POA: Diagnosis not present

## 2017-09-05 DIAGNOSIS — E119 Type 2 diabetes mellitus without complications: Secondary | ICD-10-CM | POA: Insufficient documentation

## 2017-09-05 DIAGNOSIS — M25462 Effusion, left knee: Secondary | ICD-10-CM | POA: Diagnosis not present

## 2017-09-05 DIAGNOSIS — Z87891 Personal history of nicotine dependence: Secondary | ICD-10-CM | POA: Diagnosis not present

## 2017-09-05 DIAGNOSIS — M25562 Pain in left knee: Secondary | ICD-10-CM

## 2017-09-05 MED ORDER — NAPROXEN 500 MG PO TABS: 500.0000 mg | ORAL_TABLET | Freq: Two times a day (BID) | ORAL | 0 refills | Status: DC

## 2017-09-05 NOTE — ED Provider Notes (Signed)
[facility] Emergency Department Provider Note ____________________________________________  Time seen: 1035  I have reviewed the triage vital signs and the nursing notes.  HISTORY  Chief Complaint  Knee Pain   HPI Melvin PANGILINAN Sr. is a 57 y.o. male presents to the ER today with complaint of left knee pain.  He reports this started 1 month ago, but feels like the pain worsened yesterday after he squatted down and felt a pop.  He describes the pain as aching and throbbing.  The pain does not radiate.  He denies any swelling of the left knee.  He denies numbness, tingling or weakness of the left lower extremity.  He denies any prior trauma or surgeries to the left lower extremity.  Past Medical History:  Diagnosis Date  . AK (actinic keratosis)   . Diabetes mellitus without complication ([place])   . History of TIA (transient ischemic attack)    over 20 yrs ago. no deficits.  . Hypercholesteremia   . Hyperlipidemia   . Hypertension   . Kidney stones   . WPW (Wolff-Parkinson-White syndrome)    cardio note 07/11/12    Patient Active Problem List   Diagnosis Date Noted  . Benign prostatic hyperplasia without lower urinary tract symptoms 06/21/2017  . Special screening for malignant neoplasms, colon   . Benign neoplasm of transverse colon   . Benign neoplasm of ascending colon   . Obesity, Class I, BMI 30-34.9 07/12/2015  . Overweight 04/12/2015  . Left carotid stenosis 04/12/2015  . Tinea corporis 10/03/2014  . Type 2 diabetes mellitus with diabetic neuropathy ([place]) 04/26/2014  . HLD (hyperlipidemia) 04/26/2014  . HTN (hypertension) 04/26/2014  . H/O splenectomy 04/26/2014  . Anterior circulation transient ischemic attack 04/26/2014  . WPW (Wolff-Parkinson-White syndrome) 07/11/2012    Past Surgical History:  Procedure Laterality Date  . CARDIAC CATHETERIZATION     over 20 yrs ago.  no issues per pt.  . COLONOSCOPY WITH PROPOFOL N/A 02/12/2017   Procedure: COLONOSCOPY WITH PROPOFOL;  Surgeon: Lucilla Lame, MD;  Location: [place];  Service: Endoscopy;  Laterality: N/A;  Diabetic - oral meds  . FOOT SURGERY    . HERNIA REPAIR     x2  . POLYPECTOMY  02/12/2017   Procedure: POLYPECTOMY;  Surgeon: Lucilla Lame, MD;  Location: [place];  Service: Endoscopy;;  . SPLENECTOMY      Prior to Admission medications   Medication Sig Start Date End Date Taking? Authorizing Provider  aspirin 81 MG tablet Take 81 mg by mouth daily.    [provider]  atorvastatin (LIPITOR) 40 MG tablet Take 1 tablet (40 mg total) by mouth daily. 06/21/17   Juline Patch, MD  BAYER CONTOUR TEST test strip USE ONE STRIP TO CHECK GLUCOSE ONCE DAILY 03/18/17   Juline Patch, MD  blood glucose meter kit and supplies Dispense based on patient and insurance preference. Use up to four times daily as directed. (FOR ICD-9 250.00, 250.01). 04/12/15   Plonk, Gwyndolyn Saxon, MD  glipiZIDE (GLUCOTROL) 10 MG tablet Take 1 tablet (10 mg total) by mouth daily before breakfast. 06/21/17   Juline Patch, MD  lisinopril (PRINIVIL,ZESTRIL) 20 MG tablet Take 1 tablet (20 mg total) by mouth daily. 06/21/17   Juline Patch, MD  metFORMIN (GLUCOPHAGE) 1000 MG tablet Take 1 tablet (1,000 mg total) by mouth 2 (two) times daily. 06/21/17   Juline Patch, MD  MICROLET LANCETS MISC USE ONE  TO CHECK GLUCOSE ONCE  DAILY 03/18/17   Juline Patch, MD  naproxen (NAPROSYN) 500 MG tablet Take 1 tablet (500 mg total) by mouth 2 (two) times daily with a meal. 09/05/17 09/05/18  Jearld Fenton, NP  tamsulosin (FLOMAX) 0.4 MG CAPS capsule Take 1 capsule (0.4 mg total) by mouth daily after breakfast. 06/21/17   Juline Patch, MD    Allergies Patient has no known allergies.  Family History  Problem Relation Age of Onset  . Heart disease Mother   . Heart attack Father   . Heart disease Father     Social History Social History   Tobacco Use  . Smoking status: Former  Smoker    Types: Cigarettes    Last attempt to quit: 1997    Years since quitting: 22.6  . Smokeless tobacco: Never Used  Substance Use Topics  . Alcohol use: No    Alcohol/week: 0.0 standard drinks  . Drug use: No    Review of Systems  Constitutional: Negative for fever. Musculoskeletal: Positive for left knee pain.  Negative for left knee swelling. Neurological: Negative for focal weakness, numbness or tingling of the left lower extremity. ____________________________________________  PHYSICAL EXAM:  VITAL SIGNS: ED Triage Vitals  Enc Vitals Group     BP 09/05/17 1017 (!) 153/82     Pulse Rate 09/05/17 1017 76     Resp 09/05/17 1017 18     Temp 09/05/17 1017 98.2 F (36.8 C)     Temp Source 09/05/17 1017 Oral     SpO2 09/05/17 1017 98 %     Weight 09/05/17 1018 180 lb (81.6 kg)     Height 09/05/17 1018 '5\' 7"'  (1.702 m)     Head Circumference --      Peak Flow --      Pain Score 09/05/17 1018 5     Pain Loc --      Pain Edu? --      Excl. in [place]? --     Constitutional: Alert and oriented. Well appearing and in no distress. Cardiovascular: Pedal pulse 2+ bilaterally.  Cap refill less than 3 seconds bilaterally. Musculoskeletal: Pain with full flexion and full extension of the left knee.  No joint swelling noted.  Pain with palpation of the left medial pes bursa, left medial joint line and left medial viscus.  Positive [place].  Negative [place].  Limps slightly with normal gait.  Able to walk on tiptoes and heels. Neurologic: Sensation intact to bilateral lower extremities.  ____________________________________________   RADIOLOGY  . Imaging Orders     DG Knee Complete 4 Views Left IMPRESSION: 1. Small knee effusion. 2. Mild osteoarthritis including mild medial compartmental articular space narrowing and mild articular margin spurring of the patella. ___________________________________________    INITIAL IMPRESSION / ASSESSMENT AND PLAN / ED COURSE  Acute  Left Knee Pain:  DDX include osteoarthritis, bursitis, meniscal tear or strain X-ray of left knee shows arthritic changes with effusion Rx provided for naproxen 500 mg twice daily x7 days (creatinine reviewed) Encouraged ice ice daily for 10 minutes each Knee exercises given Advised him to follow-up with PCP if pain persists or worsens.  Consider PT referral versus referral to Ortho for injection. ____________________________________________  FINAL CLINICAL IMPRESSION(S) / ED DIAGNOSES  Final diagnoses:  Acute pain of left knee  Primary osteoarthritis of left knee      Jearld Fenton, NP 09/05/17 1132    Schuyler Amor, MD 09/05/17 1511

## 2017-09-05 NOTE — Discharge Instructions (Addendum)
You have been diagnosed with acute knee pain.  The x-ray of your left knee showed mild arthritis.  I provided you prescription for Naproxen 500 mg twice a day for 7 days.  Please take with food.  Avoid other over-the-counter anti-inflammatories such as Aleve, Ibuprofen, Advil, Motrin.  You can take Tylenol as needed for pain.  Ice your knee twice a day for 10 minutes each time.  Follow-up with your primary care if symptoms worsen, may need physical therapy versus referral to Ortho.

## 2017-09-05 NOTE — ED Triage Notes (Signed)
Pt with left knee pain x 1 month; worsening yesterday. No trauma reported. Pt alert & oriented with NAD noted.

## 2017-09-05 NOTE — ED Triage Notes (Signed)
First RN: Pt c/o L knee pain x 1 month, states bent down yesterday and felt a pop, now c/o pain. Ambulatory to triage w/ steady gait.

## 2017-10-31 ENCOUNTER — Other Ambulatory Visit: Payer: Self-pay | Admitting: Family Medicine

## 2017-10-31 DIAGNOSIS — N400 Enlarged prostate without lower urinary tract symptoms: Secondary | ICD-10-CM

## 2017-11-07 DIAGNOSIS — S83242A Other tear of medial meniscus, current injury, left knee, initial encounter: Secondary | ICD-10-CM | POA: Diagnosis not present

## 2017-12-06 DIAGNOSIS — S83242A Other tear of medial meniscus, current injury, left knee, initial encounter: Secondary | ICD-10-CM | POA: Diagnosis not present

## 2017-12-11 DIAGNOSIS — M25562 Pain in left knee: Secondary | ICD-10-CM | POA: Diagnosis not present

## 2017-12-20 DIAGNOSIS — M25562 Pain in left knee: Secondary | ICD-10-CM | POA: Diagnosis not present

## 2017-12-21 ENCOUNTER — Ambulatory Visit: Payer: BLUE CROSS/BLUE SHIELD | Admitting: Family Medicine

## 2017-12-24 ENCOUNTER — Ambulatory Visit (INDEPENDENT_AMBULATORY_CARE_PROVIDER_SITE_OTHER): Payer: BLUE CROSS/BLUE SHIELD | Admitting: Family Medicine

## 2017-12-24 ENCOUNTER — Encounter: Payer: Self-pay | Admitting: Family Medicine

## 2017-12-24 VITALS — BP 123/78 | HR 99 | Resp 16 | Ht 67.0 in | Wt 181.0 lb

## 2017-12-24 DIAGNOSIS — E782 Mixed hyperlipidemia: Secondary | ICD-10-CM

## 2017-12-24 DIAGNOSIS — E1140 Type 2 diabetes mellitus with diabetic neuropathy, unspecified: Secondary | ICD-10-CM | POA: Diagnosis not present

## 2017-12-24 DIAGNOSIS — R69 Illness, unspecified: Secondary | ICD-10-CM

## 2017-12-24 DIAGNOSIS — N400 Enlarged prostate without lower urinary tract symptoms: Secondary | ICD-10-CM | POA: Diagnosis not present

## 2017-12-24 DIAGNOSIS — I10 Essential (primary) hypertension: Secondary | ICD-10-CM

## 2017-12-24 DIAGNOSIS — Z23 Encounter for immunization: Secondary | ICD-10-CM

## 2017-12-24 MED ORDER — TAMSULOSIN HCL 0.4 MG PO CAPS: ORAL_CAPSULE | ORAL | 1 refills | Status: DC

## 2017-12-24 MED ORDER — LISINOPRIL 20 MG PO TABS: 20.0000 mg | ORAL_TABLET | Freq: Every day | ORAL | 1 refills | Status: DC

## 2017-12-24 MED ORDER — GLIPIZIDE 10 MG PO TABS: 10.0000 mg | ORAL_TABLET | Freq: Every day | ORAL | 1 refills | Status: DC

## 2017-12-24 MED ORDER — METFORMIN HCL 1000 MG PO TABS: 1000.0000 mg | ORAL_TABLET | Freq: Two times a day (BID) | ORAL | 1 refills | Status: DC

## 2017-12-24 MED ORDER — ATORVASTATIN CALCIUM 40 MG PO TABS: 40.0000 mg | ORAL_TABLET | Freq: Every day | ORAL | 1 refills | Status: DC

## 2017-12-24 NOTE — Progress Notes (Signed)
Date:  12/24/2017   Name:  Melvin HODSDON Sr.   DOB:  08/06/1960   MRN:  [id number]   Chief Complaint: Diabetes and Benign Prostatic Hypertrophy  Diabetes  He presents for his follow-up diabetic visit. He has type 2 diabetes mellitus. His disease course has been stable. There are no hypoglycemic associated symptoms. Pertinent negatives for hypoglycemia include no dizziness, headaches, nervousness/anxiousness or sweats. Pertinent negatives for diabetes include no blurred vision, no chest pain, no fatigue, no foot paresthesias, no foot ulcerations, no polydipsia, no polyphagia, no polyuria, no visual change, no weakness and no weight loss. There are no hypoglycemic complications. Symptoms are stable. There are no diabetic complications. Pertinent negatives for diabetic complications include no CVA, PVD or retinopathy. Risk factors for coronary artery disease include diabetes mellitus and dyslipidemia. Current diabetic treatment includes oral agent (dual therapy). He is compliant with treatment all of the time. His weight is stable. He is following a generally healthy diet. He participates in exercise daily. His breakfast blood glucose is taken between 8-9 am. His breakfast blood glucose range is generally >200 mg/dl. An ACE inhibitor/angiotensin II receptor blocker is being taken. He does not see a podiatrist.Eye exam is not current.  Hypertension  This is a chronic problem. The current episode started more than 1 year ago. The problem has been gradually improving since onset. The problem is controlled. Pertinent negatives include no anxiety, blurred vision, chest pain, headaches, malaise/fatigue, neck pain, orthopnea, palpitations, peripheral edema, PND, shortness of breath or sweats. There are no associated agents to hypertension. Past treatments include diuretics and ACE inhibitors. The current treatment provides moderate improvement. There are no compliance problems.  There is no history of angina,  kidney disease, CAD/MI, CVA, heart failure, left ventricular hypertrophy, PVD or retinopathy. There is no history of chronic renal disease, a hypertension causing med or renovascular disease.  Hyperlipidemia  This is a chronic problem. The current episode started more than 1 year ago. Recent lipid tests were reviewed and are normal. He has no history of chronic renal disease, diabetes, hypothyroidism, liver disease or obesity. There are no known factors aggravating his hyperlipidemia. Pertinent negatives include no chest pain, focal sensory loss, focal weakness, leg pain, myalgias or shortness of breath. Current antihyperlipidemic treatment includes statins. The current treatment provides moderate improvement of lipids. There are no compliance problems.  Risk factors for coronary artery disease include dyslipidemia, hypertension and male sex.  Benign Prostatic Hypertrophy  This is a chronic problem. The problem is unchanged. Irritative symptoms include nocturia. Irritative symptoms do not include frequency or urgency. Obstructive symptoms do not include dribbling, incomplete emptying, an intermittent stream, a slower stream, straining or a weak stream. Pertinent negatives include no chills, dysuria, genital pain, hematuria, nausea or vomiting. Past treatments include tamsulosin.    Review of Systems  Constitutional: Negative for chills, fatigue, fever, malaise/fatigue and weight loss.  HENT: Negative for drooling, ear discharge, ear pain and sore throat.   Eyes: Negative for blurred vision.  Respiratory: Negative for cough, shortness of breath and wheezing.   Cardiovascular: Negative for chest pain, palpitations, orthopnea, leg swelling and PND.  Gastrointestinal: Negative for abdominal pain, blood in stool, constipation, diarrhea, nausea and vomiting.  Endocrine: Negative for polydipsia, polyphagia and polyuria.  Genitourinary: Positive for nocturia. Negative for dysuria, frequency, hematuria,  incomplete emptying and urgency.  Musculoskeletal: Negative for back pain, myalgias and neck pain.  Skin: Negative for rash.  Allergic/Immunologic: Negative for environmental allergies.  Neurological: Negative for  dizziness, focal weakness, weakness and headaches.  Hematological: Does not bruise/bleed easily.  Psychiatric/Behavioral: Negative for suicidal ideas. The patient is not nervous/anxious.     Patient Active Problem List   Diagnosis Date Noted  . Benign prostatic hyperplasia without lower urinary tract symptoms 06/21/2017  . Special screening for malignant neoplasms, colon   . Benign neoplasm of transverse colon   . Benign neoplasm of ascending colon   . Obesity, Class I, BMI 30-34.9 07/12/2015  . Overweight 04/12/2015  . Left carotid stenosis 04/12/2015  . Tinea corporis 10/03/2014  . Type 2 diabetes mellitus with diabetic neuropathy ([place]) 04/26/2014  . HLD (hyperlipidemia) 04/26/2014  . HTN (hypertension) 04/26/2014  . H/O splenectomy 04/26/2014  . Anterior circulation transient ischemic attack 04/26/2014  . WPW (Wolff-Parkinson-White syndrome) 07/11/2012    No Known Allergies  Past Surgical History:  Procedure Laterality Date  . CARDIAC CATHETERIZATION     over 20 yrs ago.  no issues per pt.  . COLONOSCOPY WITH PROPOFOL N/A 02/12/2017   Procedure: COLONOSCOPY WITH PROPOFOL;  Surgeon: Lucilla Lame, MD;  Location: [place];  Service: Endoscopy;  Laterality: N/A;  Diabetic - oral meds  . FOOT SURGERY    . HERNIA REPAIR     x2  . POLYPECTOMY  02/12/2017   Procedure: POLYPECTOMY;  Surgeon: Lucilla Lame, MD;  Location: [place];  Service: Endoscopy;;  . SPLENECTOMY      Social History   Tobacco Use  . Smoking status: Former Smoker    Types: Cigarettes    Last attempt to quit: 1997    Years since quitting: 22.9  . Smokeless tobacco: Never Used  Substance Use Topics  . Alcohol use: No    Alcohol/week: 0.0 standard drinks  . Drug use: No      Medication list has been reviewed and updated.  Current Meds  Medication Sig  . aspirin 81 MG tablet Take 81 mg by mouth daily.  Marland Kitchen atorvastatin (LIPITOR) 40 MG tablet Take 1 tablet (40 mg total) by mouth daily.  Marland Kitchen BAYER CONTOUR TEST test strip USE ONE STRIP TO CHECK GLUCOSE ONCE DAILY  . blood glucose meter kit and supplies Dispense based on patient and insurance preference. Use up to four times daily as directed. (FOR ICD-9 250.00, 250.01).  Marland Kitchen glipiZIDE (GLUCOTROL) 10 MG tablet Take 1 tablet (10 mg total) by mouth daily before breakfast.  . lisinopril (PRINIVIL,ZESTRIL) 20 MG tablet Take 1 tablet (20 mg total) by mouth daily.  . metFORMIN (GLUCOPHAGE) 1000 MG tablet Take 1 tablet (1,000 mg total) by mouth 2 (two) times daily.  Marland Kitchen MICROLET LANCETS MISC USE ONE  TO CHECK GLUCOSE ONCE DAILY  . tamsulosin (FLOMAX) 0.4 MG CAPS capsule TAKE 1 CAPSULE BY MOUTH ONCE DAILY AFTER BREAKFAST  . [DISCONTINUED] naproxen (NAPROSYN) 500 MG tablet Take 1 tablet (500 mg total) by mouth 2 (two) times daily with a meal.    PHQ 2/9 Scores 12/24/2017 06/21/2017 01/07/2017 07/12/2015  PHQ - 2 Score 0 0 0 0  PHQ- 9 Score - 0 0 -    Physical Exam Vitals signs and nursing note reviewed.  HENT:     Head: Normocephalic.     Right Ear: External ear normal.     Left Ear: External ear normal.     Nose: Nose normal.  Eyes:     General: No scleral icterus.       Right eye: No discharge.        Left eye: No discharge.  Conjunctiva/sclera: Conjunctivae normal.     Pupils: Pupils are equal, round, and reactive to light.  Neck:     Musculoskeletal: Normal range of motion and neck supple.     Thyroid: No thyromegaly.     Vascular: No JVD.     Trachea: No tracheal deviation.  Cardiovascular:     Rate and Rhythm: Normal rate and regular rhythm.     Pulses:          Dorsalis pedis pulses are 2+ on the right side and 2+ on the left side.       Posterior tibial pulses are 2+ on the right side and 2+ on the  left side.     Heart sounds: Normal heart sounds. No murmur. No friction rub. No gallop.   Pulmonary:     Effort: No respiratory distress.     Breath sounds: Normal breath sounds. No wheezing or rales.  Abdominal:     General: Bowel sounds are normal.     Palpations: Abdomen is soft. There is no mass.     Tenderness: There is no abdominal tenderness. There is no guarding or rebound.  Genitourinary:    Prostate: Normal. Not enlarged, not tender and no nodules present.     Rectum: Normal. Guaiac result negative. No mass.  Musculoskeletal: Normal range of motion.        General: No tenderness.     Right foot: Normal range of motion. No deformity, bunion, Charcot foot, foot drop or prominent metatarsal heads.     Left foot: Normal range of motion. No deformity, bunion, Charcot foot, foot drop or prominent metatarsal heads.  Feet:     Right foot:     Protective Sensation: 10 sites tested. 10 sites sensed.     Skin integrity: Skin integrity normal. No ulcer, blister, skin breakdown, erythema, warmth, callus, dry skin or fissure.     Toenail Condition: Right toenails are normal.     Left foot:     Protective Sensation: 10 sites tested. 10 sites sensed.     Skin integrity: Skin integrity normal. No ulcer, blister, skin breakdown, erythema, warmth, callus, dry skin or fissure.     Toenail Condition: Left toenails are normal.  Lymphadenopathy:     Cervical: No cervical adenopathy.  Skin:    General: Skin is warm.     Findings: No rash.  Neurological:     Mental Status: He is alert and oriented to person, place, and time.     Cranial Nerves: No cranial nerve deficit.     Deep Tendon Reflexes: Reflexes are normal and symmetric.     BP 123/78   Pulse 99   Resp 16   Ht '5\' 7"'  (1.702 m)   Wt 181 lb (82.1 kg)   SpO2 98%   BMI 28.35 kg/m   Assessment and Plan:  1. Essential hypertension Chronic, stable on meds- refill lisinopril/ draw renal - Renal Function Panel - lisinopril  (PRINIVIL,ZESTRIL) 20 MG tablet; Take 1 tablet (20 mg total) by mouth daily.  Dispense: 90 tablet; Refill: 1  2. Type 2 diabetes mellitus with diabetic neuropathy, without long-term current use of insulin (HCC) Chronic.  Controlled.  Continue glipizide 10 mg daily and metformin 1 g globin A1c and microalbuminuria.  And renal panel. - HgB A1c - Microalbumin, urine - glipiZIDE (GLUCOTROL) 10 MG tablet; Take 1 tablet (10 mg total) by mouth daily before breakfast.  Dispense: 90 tablet; Refill: 1 - metFORMIN (GLUCOPHAGE) 1000 MG tablet; Take 1  tablet (1,000 mg total) by mouth 2 (two) times daily.  Dispense: 180 tablet; Refill: 1  3. Mixed hyperlipidemia Chronic.  Controlled.  Will continue atorvastatin 40 mg 1 a day and check lipid panel. - Lipid Panel With LDL/HDL Ratio - atorvastatin (LIPITOR) 40 MG tablet; Take 1 tablet (40 mg total) by mouth daily.  Dispense: 90 tablet; Refill: 1  4. Taking medication for chronic disease Patient is on a statin and will check hepatic function. - Hepatic function panel  5. Benign prostatic hyperplasia without lower urinary tract symptoms Patient is not going to urology and I will refill his tamsulosin therefore 0.4 mg 1 every morning and check a PSA prostate exam was normal with guaiac negative. - PSA - tamsulosin (FLOMAX) 0.4 MG CAPS capsule; TAKE 1 CAPSULE BY MOUTH ONCE DAILY AFTER BREAKFAST  Dispense: 90 capsule; Refill: 1  6. Need for immunization against influenza Custom administered - Flu Vaccine QUAD 36+ mos IM

## 2017-12-25 LAB — HEPATIC FUNCTION PANEL
ALT: 22 IU/L (ref 0–44)
AST: 17 IU/L (ref 0–40)
Alkaline Phosphatase: 73 IU/L (ref 39–117)
BILIRUBIN TOTAL: 0.4 mg/dL (ref 0.0–1.2)
BILIRUBIN, DIRECT: 0.14 mg/dL (ref 0.00–0.40)
Total Protein: 7.4 g/dL (ref 6.0–8.5)

## 2017-12-25 LAB — RENAL FUNCTION PANEL
ALBUMIN: 4.5 g/dL (ref 3.5–5.5)
BUN/Creatinine Ratio: 9 (ref 9–20)
BUN: 11 mg/dL (ref 6–24)
CO2: 22 mmol/L (ref 20–29)
CREATININE: 1.25 mg/dL (ref 0.76–1.27)
Calcium: 10.5 mg/dL — ABNORMAL HIGH (ref 8.7–10.2)
Chloride: 95 mmol/L — ABNORMAL LOW (ref 96–106)
GFR, EST AFRICAN AMERICAN: 73 mL/min/{1.73_m2} (ref >59)
GFR, EST NON AFRICAN AMERICAN: 63 mL/min/{1.73_m2} (ref >59)
GLUCOSE: 131 mg/dL — AB (ref 65–99)
PHOSPHORUS: 3.9 mg/dL (ref 2.5–4.5)
POTASSIUM: 4.5 mmol/L (ref 3.5–5.2)
Sodium: 135 mmol/L (ref 134–144)

## 2017-12-25 LAB — LIPID PANEL WITH LDL/HDL RATIO
Cholesterol, Total: 145 mg/dL (ref 100–199)
HDL: 54 mg/dL (ref >39)
LDL CALC: 70 mg/dL (ref 0–99)
LDL/HDL RATIO: 1.3 ratio (ref 0.0–3.6)
TRIGLYCERIDES: 107 mg/dL (ref 0–149)
VLDL Cholesterol Cal: 21 mg/dL (ref 5–40)

## 2017-12-25 LAB — HEMOGLOBIN A1C
ESTIMATED AVERAGE GLUCOSE: 194 mg/dL
Hgb A1c MFr Bld: 8.4 % — ABNORMAL HIGH (ref 4.8–5.6)

## 2017-12-25 LAB — PSA: PROSTATE SPECIFIC AG, SERUM: 0.7 ng/mL (ref 0.0–4.0)

## 2017-12-25 LAB — MICROALBUMIN, URINE

## 2018-06-10 ENCOUNTER — Ambulatory Visit (INDEPENDENT_AMBULATORY_CARE_PROVIDER_SITE_OTHER): Payer: BLUE CROSS/BLUE SHIELD | Admitting: Family Medicine

## 2018-06-10 ENCOUNTER — Other Ambulatory Visit: Payer: Self-pay

## 2018-06-10 ENCOUNTER — Encounter: Payer: Self-pay | Admitting: Family Medicine

## 2018-06-10 VITALS — BP 152/90 | HR 98 | Resp 16 | Ht 67.0 in | Wt 181.0 lb

## 2018-06-10 DIAGNOSIS — E1140 Type 2 diabetes mellitus with diabetic neuropathy, unspecified: Secondary | ICD-10-CM | POA: Diagnosis not present

## 2018-06-10 DIAGNOSIS — E782 Mixed hyperlipidemia: Secondary | ICD-10-CM

## 2018-06-10 DIAGNOSIS — I10 Essential (primary) hypertension: Secondary | ICD-10-CM | POA: Diagnosis not present

## 2018-06-10 MED ORDER — AMLODIPINE BESYLATE 5 MG PO TABS: 5.0000 mg | ORAL_TABLET | Freq: Every day | ORAL | 3 refills | Status: DC

## 2018-06-10 MED ORDER — GLIPIZIDE ER 10 MG PO TB24: 10.0000 mg | ORAL_TABLET | Freq: Every day | ORAL | 1 refills | Status: DC

## 2018-06-10 NOTE — Progress Notes (Signed)
Date:  06/10/2018   Name:  Melvin MINJARES Sr.   DOB:  03/27/1960   MRN:  [id number]   Chief Complaint: Diabetes  Diabetes  He presents for his follow-up diabetic visit. He has type 2 diabetes mellitus. His disease course has been stable. There are no hypoglycemic associated symptoms. Pertinent negatives for diabetes include no blurred vision, no chest pain, no fatigue, no foot paresthesias, no foot ulcerations, no polydipsia, no polyphagia, no polyuria, no visual change, no weakness and no weight loss. There are no hypoglycemic complications. Symptoms are stable. There are no diabetic complications. Pertinent negatives for diabetic complications include no autonomic neuropathy, CVA, heart disease, impotence, nephropathy, peripheral neuropathy, PVD or retinopathy. Risk factors for coronary artery disease include diabetes mellitus, dyslipidemia and hypertension. Current diabetic treatment includes oral agent (dual therapy). He is compliant with treatment some of the time. His weight is stable. He is following a generally healthy diet. He participates in exercise intermittently. His breakfast blood glucose is taken between 8-9 am. His lunch blood glucose range is generally 140-180 mg/dl. An ACE inhibitor/angiotensin II receptor blocker is being taken.    Review of Systems  Constitutional: Negative for fatigue and weight loss.  Eyes: Negative for blurred vision.  Cardiovascular: Negative for chest pain.  Endocrine: Negative for polydipsia, polyphagia and polyuria.  Genitourinary: Negative for impotence.  Neurological: Negative for weakness.    Patient Active Problem List   Diagnosis Date Noted  . Benign prostatic hyperplasia without lower urinary tract symptoms 06/21/2017  . Special screening for malignant neoplasms, colon   . Benign neoplasm of transverse colon   . Benign neoplasm of ascending colon   . Obesity, Class I, BMI 30-34.9 07/12/2015  . Overweight 04/12/2015  . Left carotid  stenosis 04/12/2015  . Tinea corporis 10/03/2014  . Type 2 diabetes mellitus with diabetic neuropathy ([place]) 04/26/2014  . HLD (hyperlipidemia) 04/26/2014  . HTN (hypertension) 04/26/2014  . H/O splenectomy 04/26/2014  . Anterior circulation transient ischemic attack 04/26/2014  . WPW (Wolff-Parkinson-White syndrome) 07/11/2012    No Known Allergies  Past Surgical History:  Procedure Laterality Date  . CARDIAC CATHETERIZATION     over 20 yrs ago.  no issues per pt.  . COLONOSCOPY WITH PROPOFOL N/A 02/12/2017   Procedure: COLONOSCOPY WITH PROPOFOL;  Surgeon: Lucilla Lame, MD;  Location: [place];  Service: Endoscopy;  Laterality: N/A;  Diabetic - oral meds  . FOOT SURGERY    . HERNIA REPAIR     x2  . POLYPECTOMY  02/12/2017   Procedure: POLYPECTOMY;  Surgeon: Lucilla Lame, MD;  Location: [place];  Service: Endoscopy;;  . SPLENECTOMY      Social History   Tobacco Use  . Smoking status: Former Smoker    Types: Cigarettes    Last attempt to quit: 1997    Years since quitting: 23.4  . Smokeless tobacco: Never Used  Substance Use Topics  . Alcohol use: No    Alcohol/week: 0.0 standard drinks  . Drug use: No     Medication list has been reviewed and updated.  Current Meds  Medication Sig  . aspirin 81 MG tablet Take 81 mg by mouth daily.  Marland Kitchen atorvastatin (LIPITOR) 40 MG tablet Take 1 tablet (40 mg total) by mouth daily.  Marland Kitchen BAYER CONTOUR TEST test strip USE ONE STRIP TO CHECK GLUCOSE ONCE DAILY  . blood glucose meter kit and supplies Dispense based on patient and insurance preference. Use up to four times daily  as directed. (FOR ICD-9 250.00, 250.01).  Marland Kitchen glipiZIDE (GLUCOTROL) 10 MG tablet Take 1 tablet (10 mg total) by mouth daily before breakfast.  . lisinopril (PRINIVIL,ZESTRIL) 20 MG tablet Take 1 tablet (20 mg total) by mouth daily.  . metFORMIN (GLUCOPHAGE) 1000 MG tablet Take 1 tablet (1,000 mg total) by mouth 2 (two) times daily.  Marland Kitchen MICROLET LANCETS  MISC USE ONE  TO CHECK GLUCOSE ONCE DAILY  . tamsulosin (FLOMAX) 0.4 MG CAPS capsule TAKE 1 CAPSULE BY MOUTH ONCE DAILY AFTER BREAKFAST    PHQ 2/9 Scores 06/10/2018 12/24/2017 06/21/2017 01/07/2017  PHQ - 2 Score 0 0 0 0  PHQ- 9 Score 0 - 0 0    BP Readings from Last 3 Encounters:  06/10/18 (!) 152/90  12/24/17 123/78  09/05/17 (!) 142/86    Physical Exam  Wt Readings from Last 3 Encounters:  06/10/18 181 lb (82.1 kg)  12/24/17 181 lb (82.1 kg)  09/05/17 180 lb (81.6 kg)    BP (!) 152/90   Pulse 98   Resp 16   Ht '5\' 7"'  (1.702 m)   Wt 181 lb (82.1 kg)   SpO2 99%   BMI 28.35 kg/m   Assessment and Plan: 1. Type 2 diabetes mellitus with diabetic neuropathy, without long-term current use of insulin (HCC) Chronic.  Has been under questionable control in the past most recently patient had an A1c of 8.4 which is not optimal patient seems to think it is acceptable.  We recently cut back on his glipizide 10 mg just once a day but noted that this is probably not 24-hour coverage for take before his evening meal.  We will initiate at 10 mg extended release and will check an A1c today and determine if we need to add anything else to the regimen such as Victoza or Jardiance. - Hemoglobin A1c  2. Essential hypertension Patient was noted to have an elevated blood pressure reading which will not past current DOT circumstance we will add amlodipine 5 mg to his regimen of lisinopril 20 mg and recheck this in the coming weeks. - Renal Function Panel  3. Mixed hyperlipidemia Patient has a history of hyperlipidemia for which we will check his lipid level and adjust accordingly. - Lipid panel

## 2018-06-11 LAB — RENAL FUNCTION PANEL
Albumin: 4.3 g/dL (ref 3.8–4.9)
BUN/Creatinine Ratio: 13 (ref 9–20)
BUN: 17 mg/dL (ref 6–24)
CO2: 23 mmol/L (ref 20–29)
Calcium: 10.6 mg/dL — ABNORMAL HIGH (ref 8.7–10.2)
Chloride: 97 mmol/L (ref 96–106)
Creatinine, Ser: 1.34 mg/dL — ABNORMAL HIGH (ref 0.76–1.27)
GFR calc Af Amer: 67 mL/min/{1.73_m2} (ref >59)
GFR calc non Af Amer: 58 mL/min/{1.73_m2} — ABNORMAL LOW (ref >59)
Glucose: 210 mg/dL — ABNORMAL HIGH (ref 65–99)
Phosphorus: 3.9 mg/dL (ref 2.8–4.1)
Potassium: 4.4 mmol/L (ref 3.5–5.2)
Sodium: 137 mmol/L (ref 134–144)

## 2018-06-11 LAB — LIPID PANEL
Chol/HDL Ratio: 4 ratio (ref 0.0–5.0)
Cholesterol, Total: 192 mg/dL (ref 100–199)
HDL: 48 mg/dL (ref >39)
LDL Calculated: 105 mg/dL — ABNORMAL HIGH (ref 0–99)
Triglycerides: 196 mg/dL — ABNORMAL HIGH (ref 0–149)
VLDL Cholesterol Cal: 39 mg/dL (ref 5–40)

## 2018-06-11 LAB — HEMOGLOBIN A1C
Est. average glucose Bld gHb Est-mCnc: 217 mg/dL
Hgb A1c MFr Bld: 9.2 % — ABNORMAL HIGH (ref 4.8–5.6)

## 2018-06-20 ENCOUNTER — Other Ambulatory Visit: Payer: Self-pay

## 2018-06-20 ENCOUNTER — Encounter: Payer: Self-pay | Admitting: Family Medicine

## 2018-06-20 ENCOUNTER — Telehealth: Payer: Self-pay | Admitting: Family Medicine

## 2018-06-20 ENCOUNTER — Ambulatory Visit (INDEPENDENT_AMBULATORY_CARE_PROVIDER_SITE_OTHER): Payer: BLUE CROSS/BLUE SHIELD | Admitting: Family Medicine

## 2018-06-20 VITALS — BP 138/70 | HR 60 | Ht 67.0 in | Wt 178.0 lb

## 2018-06-20 DIAGNOSIS — I10 Essential (primary) hypertension: Secondary | ICD-10-CM

## 2018-06-20 DIAGNOSIS — E119 Type 2 diabetes mellitus without complications: Secondary | ICD-10-CM | POA: Diagnosis not present

## 2018-06-20 NOTE — Telephone Encounter (Signed)
Patient forgot to mention at appointment about refill for meter strips.

## 2018-06-20 NOTE — Progress Notes (Signed)
Date:  06/20/2018   Name:  Melvin QUE Sr.   DOB:  07-26-1960   MRN:  [id number]   Chief Complaint: Hypertension and Diabetes  Hypertension  This is a chronic problem. The current episode started more than 1 year ago. The problem has been gradually improving since onset. The problem is controlled. Pertinent negatives include no anxiety, blurred vision, chest pain, headaches, malaise/fatigue, neck pain, orthopnea, palpitations, peripheral edema, PND, shortness of breath or sweats. There are no associated agents to hypertension. Risk factors for coronary artery disease include diabetes mellitus and dyslipidemia.  Diabetes  Pertinent negatives for hypoglycemia include no dizziness, headaches, nervousness/anxiousness or sweats. Pertinent negatives for diabetes include no blurred vision, no chest pain and no polydipsia.    Review of Systems  Constitutional: Negative for chills, fever and malaise/fatigue.  HENT: Negative for drooling, ear discharge, ear pain and sore throat.   Eyes: Negative for blurred vision.  Respiratory: Negative for cough, shortness of breath and wheezing.   Cardiovascular: Negative for chest pain, palpitations, orthopnea, leg swelling and PND.  Gastrointestinal: Negative for abdominal pain, blood in stool, constipation, diarrhea and nausea.  Endocrine: Negative for polydipsia.  Genitourinary: Negative for dysuria, frequency, hematuria and urgency.  Musculoskeletal: Negative for back pain, myalgias and neck pain.  Skin: Negative for rash.  Allergic/Immunologic: Negative for environmental allergies.  Neurological: Negative for dizziness and headaches.  Hematological: Does not bruise/bleed easily.  Psychiatric/Behavioral: Negative for suicidal ideas. The patient is not nervous/anxious.     Patient Active Problem List   Diagnosis Date Noted  . Benign prostatic hyperplasia without lower urinary tract symptoms 06/21/2017  . Special screening for malignant neoplasms,  colon   . Benign neoplasm of transverse colon   . Benign neoplasm of ascending colon   . Obesity, Class I, BMI 30-34.9 07/12/2015  . Overweight 04/12/2015  . Left carotid stenosis 04/12/2015  . Tinea corporis 10/03/2014  . HLD (hyperlipidemia) 04/26/2014  . HTN (hypertension) 04/26/2014  . H/O splenectomy 04/26/2014  . Anterior circulation transient ischemic attack 04/26/2014  . WPW (Wolff-Parkinson-White syndrome) 07/11/2012    No Known Allergies  Past Surgical History:  Procedure Laterality Date  . CARDIAC CATHETERIZATION     over 20 yrs ago.  no issues per pt.  . COLONOSCOPY WITH PROPOFOL N/A 02/12/2017   Procedure: COLONOSCOPY WITH PROPOFOL;  Surgeon: Lucilla Lame, MD;  Location: [facility];  Service: Endoscopy;  Laterality: N/A;  Diabetic - oral meds  . FOOT SURGERY    . HERNIA REPAIR     x2  . POLYPECTOMY  02/12/2017   Procedure: POLYPECTOMY;  Surgeon: Lucilla Lame, MD;  Location: [facility];  Service: Endoscopy;;  . SPLENECTOMY      Social History   Tobacco Use  . Smoking status: Former Smoker    Types: Cigarettes    Last attempt to quit: 1997    Years since quitting: 23.4  . Smokeless tobacco: Never Used  Substance Use Topics  . Alcohol use: No    Alcohol/week: 0.0 standard drinks  . Drug use: No     Medication list has been reviewed and updated.  Current Meds  Medication Sig  . amLODipine (NORVASC) 5 MG tablet Take 1 tablet (5 mg total) by mouth daily.  Marland Kitchen aspirin 81 MG tablet Take 81 mg by mouth daily.  Marland Kitchen atorvastatin (LIPITOR) 40 MG tablet Take 1 tablet (40 mg total) by mouth daily.  Marland Kitchen BAYER CONTOUR TEST test strip USE ONE STRIP TO CHECK  GLUCOSE ONCE DAILY  . blood glucose meter kit and supplies Dispense based on patient and insurance preference. Use up to four times daily as directed. (FOR ICD-9 250.00, 250.01).  Marland Kitchen glipiZIDE (GLUCOTROL XL) 10 MG 24 hr tablet Take 1 tablet (10 mg total) by mouth daily with breakfast.  . lisinopril  (PRINIVIL,ZESTRIL) 20 MG tablet Take 1 tablet (20 mg total) by mouth daily.  . metFORMIN (GLUCOPHAGE) 1000 MG tablet Take 1 tablet (1,000 mg total) by mouth 2 (two) times daily.  Marland Kitchen MICROLET LANCETS MISC USE ONE  TO CHECK GLUCOSE ONCE DAILY  . tamsulosin (FLOMAX) 0.4 MG CAPS capsule TAKE 1 CAPSULE BY MOUTH ONCE DAILY AFTER BREAKFAST    PHQ 2/9 Scores 06/10/2018 12/24/2017 06/21/2017 01/07/2017  PHQ - 2 Score 0 0 0 0  PHQ- 9 Score 0 - 0 0    BP Readings from Last 3 Encounters:  06/20/18 138/70  06/10/18 (!) 152/90  12/24/17 123/78    Physical Exam Vitals signs and nursing note reviewed.  HENT:     Head: Normocephalic.     Right Ear: Tympanic membrane, ear canal and external ear normal.     Left Ear: Tympanic membrane, ear canal and external ear normal.     Nose: Nose normal.  Eyes:     General: No scleral icterus.       Right eye: No discharge.        Left eye: No discharge.     Conjunctiva/sclera: Conjunctivae normal.     Pupils: Pupils are equal, round, and reactive to light.  Neck:     Musculoskeletal: Normal range of motion and neck supple.     Thyroid: No thyromegaly.     Vascular: No JVD.     Trachea: No tracheal deviation.  Cardiovascular:     Rate and Rhythm: Normal rate and regular rhythm.     Chest Wall: PMI is not displaced. No thrill.     Pulses: Normal pulses.     Heart sounds: Normal heart sounds, S1 normal and S2 normal. No murmur. No systolic murmur. No diastolic murmur. No friction rub. No gallop. No S3 sounds.   Pulmonary:     Effort: No respiratory distress.     Breath sounds: Normal breath sounds. No wheezing or rales.  Abdominal:     General: Bowel sounds are normal.     Palpations: Abdomen is soft. There is no mass.     Tenderness: There is no abdominal tenderness. There is no guarding or rebound.  Musculoskeletal: Normal range of motion.        General: No tenderness.     Right lower leg: No edema.     Left lower leg: No edema.  Lymphadenopathy:      Cervical: No cervical adenopathy.  Skin:    General: Skin is warm.     Findings: No rash.  Neurological:     Mental Status: He is alert and oriented to person, place, and time.     Cranial Nerves: No cranial nerve deficit.     Deep Tendon Reflexes: Reflexes are normal and symmetric.     Wt Readings from Last 3 Encounters:  06/20/18 178 lb (80.7 kg)  06/10/18 181 lb (82.1 kg)  12/24/17 181 lb (82.1 kg)    BP 138/70   Pulse 60   Ht '5\' 7"'  (1.702 m)   Wt 178 lb (80.7 kg)   BMI 27.88 kg/m   Assessment and Plan:  1. Essential hypertension Patient is here for  recheck of blood pressure prior to DOT physical blood pressure is noted to be 138/70 patient has had no concerns with his medications.  Will be given low sodium diet and this was discussed with him.  Patient will continue lisinopril and amlodipine and will will be rechecked in 2 months.  2. Type 2 diabetes mellitus without complication, without long-term current use of insulin ([place]) Patient has elevated A1c in the 9 range however with discussion and the beginning of glipizide XL and metformin patient has had fasting blood sugars in the 90s range.  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

## 2018-06-21 ENCOUNTER — Encounter: Payer: Self-pay | Admitting: Family Medicine

## 2018-07-18 LAB — HM DIABETES EYE EXAM

## 2018-07-21 ENCOUNTER — Other Ambulatory Visit: Payer: Self-pay

## 2018-08-15 ENCOUNTER — Other Ambulatory Visit: Payer: Self-pay

## 2018-08-15 MED ORDER — GLIPIZIDE ER 10 MG PO TB24: 10.0000 mg | ORAL_TABLET | Freq: Every day | ORAL | 0 refills | Status: DC

## 2018-08-24 ENCOUNTER — Ambulatory Visit: Payer: BLUE CROSS/BLUE SHIELD | Admitting: Family Medicine

## 2018-08-29 ENCOUNTER — Ambulatory Visit (INDEPENDENT_AMBULATORY_CARE_PROVIDER_SITE_OTHER): Payer: BC Managed Care – PPO | Admitting: Family Medicine

## 2018-08-29 ENCOUNTER — Encounter: Payer: Self-pay | Admitting: Family Medicine

## 2018-08-29 ENCOUNTER — Other Ambulatory Visit: Payer: Self-pay

## 2018-08-29 VITALS — BP 122/78 | HR 74 | Resp 16 | Ht 67.0 in | Wt 178.0 lb

## 2018-08-29 DIAGNOSIS — E1140 Type 2 diabetes mellitus with diabetic neuropathy, unspecified: Secondary | ICD-10-CM | POA: Diagnosis not present

## 2018-08-29 MED ORDER — GLIPIZIDE ER 10 MG PO TB24: 10.0000 mg | ORAL_TABLET | Freq: Every day | ORAL | 1 refills | Status: DC

## 2018-08-29 NOTE — Progress Notes (Signed)
Date:  08/29/2018   Name:  Melvin SEVERT Sr.   DOB:  10/21/1960   MRN:  [id number]   Chief Complaint: Diabetes (4 month follow up)  Diabetes He presents for his follow-up diabetic visit. He has type 2 diabetes mellitus. His disease course has been stable. There are no hypoglycemic associated symptoms. Pertinent negatives for hypoglycemia include no dizziness, headaches or nervousness/anxiousness. Pertinent negatives for diabetes include no blurred vision, no chest pain, no fatigue, no foot paresthesias, no foot ulcerations, no polydipsia, no polyphagia, no polyuria, no visual change, no weakness and no weight loss. (Neuropathy) There are no hypoglycemic complications. There are no diabetic complications. There are no known risk factors for coronary artery disease. Current diabetic treatment includes oral agent (dual therapy). He is following a generally healthy diet. Meal planning includes carbohydrate counting and avoidance of concentrated sweets. He participates in exercise daily. His home blood glucose trend is fluctuating minimally. His breakfast blood glucose is taken between 8-9 am. His breakfast blood glucose range is generally 140-180 mg/dl. An ACE inhibitor/angiotensin II receptor blocker is being taken. He does not see a podiatrist.Eye exam is current.    Review of Systems  Constitutional: Negative for chills, fatigue, fever and weight loss.  HENT: Negative for drooling, ear discharge, ear pain and sore throat.   Eyes: Negative for blurred vision.  Respiratory: Negative for cough, shortness of breath and wheezing.   Cardiovascular: Negative for chest pain, palpitations and leg swelling.  Gastrointestinal: Negative for abdominal pain, blood in stool, constipation, diarrhea and nausea.  Endocrine: Negative for polydipsia, polyphagia and polyuria.  Genitourinary: Negative for dysuria, frequency, hematuria and urgency.  Musculoskeletal: Negative for back pain, myalgias and neck pain.   Skin: Negative for rash.  Allergic/Immunologic: Negative for environmental allergies.  Neurological: Negative for dizziness, weakness and headaches.  Hematological: Does not bruise/bleed easily.  Psychiatric/Behavioral: Negative for suicidal ideas. The patient is not nervous/anxious.     Patient Active Problem List   Diagnosis Date Noted  . Benign prostatic hyperplasia without lower urinary tract symptoms 06/21/2017  . Special screening for malignant neoplasms, colon   . Benign neoplasm of transverse colon   . Benign neoplasm of ascending colon   . Obesity, Class I, BMI 30-34.9 07/12/2015  . Overweight 04/12/2015  . Left carotid stenosis 04/12/2015  . Tinea corporis 10/03/2014  . HLD (hyperlipidemia) 04/26/2014  . HTN (hypertension) 04/26/2014  . H/O splenectomy 04/26/2014  . Anterior circulation transient ischemic attack 04/26/2014  . WPW (Wolff-Parkinson-White syndrome) 07/11/2012    No Known Allergies  Past Surgical History:  Procedure Laterality Date  . CARDIAC CATHETERIZATION     over 20 yrs ago.  no issues per pt.  . COLONOSCOPY WITH PROPOFOL N/A 02/12/2017   Procedure: COLONOSCOPY WITH PROPOFOL;  Surgeon: Lucilla Lame, MD;  Location: [facility];  Service: Endoscopy;  Laterality: N/A;  Diabetic - oral meds  . FOOT SURGERY    . HERNIA REPAIR     x2  . POLYPECTOMY  02/12/2017   Procedure: POLYPECTOMY;  Surgeon: Lucilla Lame, MD;  Location: [facility];  Service: Endoscopy;;  . SPLENECTOMY      Social History   Tobacco Use  . Smoking status: Former Smoker    Types: Cigarettes    Quit date: 1997    Years since quitting: 23.6  . Smokeless tobacco: Never Used  Substance Use Topics  . Alcohol use: No    Alcohol/week: 0.0 standard drinks  . Drug use: No  Medication list has been reviewed and updated.  Current Meds  Medication Sig  . amLODipine (NORVASC) 5 MG tablet Take 1 tablet (5 mg total) by mouth daily.  Marland Kitchen aspirin 81 MG tablet Take 81 mg  by mouth daily.  Marland Kitchen atorvastatin (LIPITOR) 40 MG tablet Take 1 tablet (40 mg total) by mouth daily.  Marland Kitchen BAYER CONTOUR TEST test strip USE ONE STRIP TO CHECK GLUCOSE ONCE DAILY  . blood glucose meter kit and supplies Dispense based on patient and insurance preference. Use up to four times daily as directed. (FOR ICD-9 250.00, 250.01).  Marland Kitchen glipiZIDE (GLUCOTROL XL) 10 MG 24 hr tablet Take 1 tablet (10 mg total) by mouth daily with breakfast.  . lisinopril (PRINIVIL,ZESTRIL) 20 MG tablet Take 1 tablet (20 mg total) by mouth daily.  . metFORMIN (GLUCOPHAGE) 1000 MG tablet Take 1 tablet (1,000 mg total) by mouth 2 (two) times daily.  Marland Kitchen MICROLET LANCETS MISC USE ONE  TO CHECK GLUCOSE ONCE DAILY  . tamsulosin (FLOMAX) 0.4 MG CAPS capsule TAKE 1 CAPSULE BY MOUTH ONCE DAILY AFTER BREAKFAST    PHQ 2/9 Scores 08/29/2018 06/10/2018 12/24/2017 06/21/2017  PHQ - 2 Score 0 0 0 0  PHQ- 9 Score - 0 - 0    BP Readings from Last 3 Encounters:  08/29/18 122/78  06/20/18 138/70  06/10/18 (!) 152/90    Physical Exam Vitals signs and nursing note reviewed.  HENT:     Head: Normocephalic.     Right Ear: Tympanic membrane, ear canal and external ear normal.     Left Ear: Tympanic membrane, ear canal and external ear normal.     Nose: Nose normal.     Mouth/Throat:     Mouth: Mucous membranes are moist.  Eyes:     General: No scleral icterus.       Right eye: No discharge.        Left eye: No discharge.     Conjunctiva/sclera: Conjunctivae normal.     Pupils: Pupils are equal, round, and reactive to light.  Neck:     Musculoskeletal: Normal range of motion and neck supple. No neck rigidity.     Thyroid: No thyromegaly.     Vascular: No JVD.     Trachea: No tracheal deviation.  Cardiovascular:     Rate and Rhythm: Normal rate and regular rhythm.     Pulses: Normal pulses.          Dorsalis pedis pulses are 2+ on the right side and 2+ on the left side.       Posterior tibial pulses are 2+ on the right  side and 2+ on the left side.     Heart sounds: Normal heart sounds. No murmur. No friction rub. No gallop.   Pulmonary:     Effort: Pulmonary effort is normal. No respiratory distress.     Breath sounds: Normal breath sounds. No wheezing, rhonchi or rales.  Abdominal:     General: Bowel sounds are normal.     Palpations: Abdomen is soft. There is no mass.     Tenderness: There is no abdominal tenderness. There is no guarding or rebound.  Musculoskeletal: Normal range of motion.        General: No tenderness.     Right foot: Normal range of motion. No deformity.     Left foot: Normal range of motion. No deformity.  Feet:     Right foot:     Protective Sensation: 10 sites tested. 10 sites  sensed.     Skin integrity: Skin integrity normal. No ulcer, blister, skin breakdown, erythema, warmth, callus, dry skin or fissure.     Toenail Condition: Right toenails are normal.     Left foot:     Protective Sensation: 10 sites tested. 10 sites sensed.     Skin integrity: Skin integrity normal. No ulcer, blister, skin breakdown, erythema, warmth, callus, dry skin or fissure.     Toenail Condition: Left toenails are normal.  Lymphadenopathy:     Cervical: No cervical adenopathy.     Upper Body:     Right upper body: No supraclavicular adenopathy.     Left upper body: No supraclavicular adenopathy.  Skin:    General: Skin is warm.     Capillary Refill: Capillary refill takes less than 2 seconds.     Findings: No rash.  Neurological:     Mental Status: He is alert and oriented to person, place, and time.     Cranial Nerves: No cranial nerve deficit.     Deep Tendon Reflexes: Reflexes are normal and symmetric.     Wt Readings from Last 3 Encounters:  08/29/18 178 lb (80.7 kg)  06/20/18 178 lb (80.7 kg)  06/10/18 181 lb (82.1 kg)    BP 122/78   Pulse 74   Resp 16   Ht '5\' 7"'  (1.702 m)   Wt 178 lb (80.7 kg)   SpO2 98%   BMI 27.88 kg/m   Assessment and Plan: 1. Type 2 diabetes  mellitus with diabetic neuropathy, without long-term current use of insulin ([facility]) Diabetic control was discussed with patient upon looking at his fasting glucose numbers these are at the borderline for control.  Patient is currently and will continue his glipizide 10 mg XL once a day as well as and metformin 500 mg twice a day.  A1c will be obtained today and if not in reasonable range will consider adding Actos to the current regimen.  If significant A1c reading we may have to refer to endocrinology for initiation of Jardiance. - Hemoglobin A1c - glipiZIDE (GLUCOTROL XL) 10 MG 24 hr tablet; Take 1 tablet (10 mg total) by mouth daily with breakfast.  Dispense: 90 tablet; Refill: 1

## 2018-08-30 ENCOUNTER — Other Ambulatory Visit: Payer: Self-pay

## 2018-08-30 DIAGNOSIS — E1140 Type 2 diabetes mellitus with diabetic neuropathy, unspecified: Secondary | ICD-10-CM

## 2018-08-30 LAB — HEMOGLOBIN A1C
Est. average glucose Bld gHb Est-mCnc: 177 mg/dL
Hgb A1c MFr Bld: 7.8 % — ABNORMAL HIGH (ref 4.8–5.6)

## 2018-08-30 MED ORDER — PIOGLITAZONE HCL 15 MG PO TABS: 15.0000 mg | ORAL_TABLET | Freq: Every day | ORAL | 1 refills | Status: DC

## 2018-08-30 NOTE — Progress Notes (Unsigned)
Sent in Actos

## 2018-10-10 ENCOUNTER — Other Ambulatory Visit: Payer: Self-pay | Admitting: Family Medicine

## 2018-10-10 DIAGNOSIS — I10 Essential (primary) hypertension: Secondary | ICD-10-CM

## 2018-11-07 ENCOUNTER — Other Ambulatory Visit: Payer: Self-pay | Admitting: Family Medicine

## 2018-11-07 DIAGNOSIS — E1140 Type 2 diabetes mellitus with diabetic neuropathy, unspecified: Secondary | ICD-10-CM

## 2018-11-07 DIAGNOSIS — E782 Mixed hyperlipidemia: Secondary | ICD-10-CM

## 2018-11-07 DIAGNOSIS — N400 Enlarged prostate without lower urinary tract symptoms: Secondary | ICD-10-CM

## 2019-01-13 ENCOUNTER — Other Ambulatory Visit: Payer: Self-pay | Admitting: Family Medicine

## 2019-01-13 DIAGNOSIS — E1140 Type 2 diabetes mellitus with diabetic neuropathy, unspecified: Secondary | ICD-10-CM

## 2019-02-11 ENCOUNTER — Ambulatory Visit (INDEPENDENT_AMBULATORY_CARE_PROVIDER_SITE_OTHER): Payer: BLUE CROSS/BLUE SHIELD

## 2019-02-11 ENCOUNTER — Other Ambulatory Visit: Payer: Self-pay

## 2019-02-11 ENCOUNTER — Ambulatory Visit
Admission: EM | Admit: 2019-02-11 | Discharge: 2019-02-11 | Disposition: A | Discharge status: Home / Self Care | Payer: BLUE CROSS/BLUE SHIELD | Attending: Family Medicine | Admitting: Family Medicine

## 2019-02-11 DIAGNOSIS — S61412A Laceration without foreign body of left hand, initial encounter: Secondary | ICD-10-CM

## 2019-02-11 DIAGNOSIS — M79642 Pain in left hand: Secondary | ICD-10-CM

## 2019-02-11 DIAGNOSIS — W298XXA Contact with other powered powered hand tools and household machinery, initial encounter: Secondary | ICD-10-CM

## 2019-02-11 MED ORDER — CEPHALEXIN 500 MG PO CAPS: 500.0000 mg | ORAL_CAPSULE | Freq: Three times a day (TID) | ORAL | 0 refills | Status: AC

## 2019-02-11 NOTE — Discharge Instructions (Addendum)
Take medication as prescribed. Topical antibiotic daily. Keep clean.   Suture removal in 10 days.   Follow up with your primary care physician this week as needed. Return to Urgent care for new or worsening concerns.

## 2019-02-11 NOTE — ED Provider Notes (Signed)
[facility] ____________________________________________  Time seen: Approximately 2:30 PM  I have reviewed the triage vital signs and the nursing notes.   HISTORY  Chief Complaint Laceration   HPI Melvin FESLER Sr. is a 59 y.o. male presenting for evaluation of left hand laceration that occurred just prior to arrival.  Patient reports he was using a drill to work on a shed and the drill jumped and the screw went into his left hand.  States minimal pain at this time.  Reports tetanus immunization is up-to-date within the last 5 years.  Denies decreased strength, paresthesias, decreased range of motion to left hand.  Right-hand-dominant.  Denies recent sickness.  Did clean prior to arrival.  Denies other aggravating or alleviating factors.  Juline Patch, MD : PCP   Past Medical History:  Diagnosis Date  . AK (actinic keratosis)   . Diabetes mellitus without complication ([place])   . History of TIA (transient ischemic attack)    over 20 yrs ago. no deficits.  . Hypercholesteremia   . Hyperlipidemia   . Hypertension   . Kidney stones   . WPW (Wolff-Parkinson-White syndrome)    cardio note 07/11/12    Patient Active Problem List   Diagnosis Date Noted  . Benign prostatic hyperplasia without lower urinary tract symptoms 06/21/2017  . Special screening for malignant neoplasms, colon   . Benign neoplasm of transverse colon   . Benign neoplasm of ascending colon   . Obesity, Class I, BMI 30-34.9 07/12/2015  . Overweight 04/12/2015  . Left carotid stenosis 04/12/2015  . Tinea corporis 10/03/2014  . HLD (hyperlipidemia) 04/26/2014  . HTN (hypertension) 04/26/2014  . H/O splenectomy 04/26/2014  . Anterior circulation transient ischemic attack 04/26/2014  . WPW (Wolff-Parkinson-White syndrome) 07/11/2012    Past Surgical History:  Procedure Laterality Date  . CARDIAC CATHETERIZATION     over 20 yrs ago.  no issues per pt.  . COLONOSCOPY WITH PROPOFOL N/A  02/12/2017   Procedure: COLONOSCOPY WITH PROPOFOL;  Surgeon: Lucilla Lame, MD;  Location: [place];  Service: Endoscopy;  Laterality: N/A;  Diabetic - oral meds  . FOOT SURGERY    . HERNIA REPAIR     x2  . POLYPECTOMY  02/12/2017   Procedure: POLYPECTOMY;  Surgeon: Lucilla Lame, MD;  Location: [place];  Service: Endoscopy;;  . SPLENECTOMY       No current facility-administered medications for this encounter.  Current Outpatient Medications:  .  amLODipine (NORVASC) 5 MG tablet, Take 1 tablet (5 mg total) by mouth daily., Disp: 90 tablet, Rfl: 3 .  aspirin 81 MG tablet, Take 81 mg by mouth daily., Disp: , Rfl:  .  atorvastatin (LIPITOR) 40 MG tablet, Take 1 tablet by mouth once daily, Disp: 90 tablet, Rfl: 0 .  BAYER CONTOUR TEST test strip, USE ONE STRIP TO CHECK GLUCOSE ONCE DAILY, Disp: 100 each, Rfl: 0 .  blood glucose meter kit and supplies, Dispense based on patient and insurance preference. Use up to four times daily as directed. (FOR ICD-9 250.00, 250.01)., Disp: 1 each, Rfl: 0 .  glipiZIDE (GLUCOTROL XL) 10 MG 24 hr tablet, Take 1 tablet (10 mg total) by mouth daily with breakfast., Disp: 90 tablet, Rfl: 1 .  lisinopril (ZESTRIL) 20 MG tablet, Take 1 tablet by mouth once daily, Disp: 90 tablet, Rfl: 0 .  metFORMIN (GLUCOPHAGE) 1000 MG tablet, Take 1 tablet (1,000 mg total) by mouth 2 (two) times daily., Disp: 180 tablet, Rfl: 1 .  MICROLET LANCETS MISC, USE ONE  TO CHECK GLUCOSE ONCE DAILY, Disp: 100 each, Rfl: 1 .  tamsulosin (FLOMAX) 0.4 MG CAPS capsule, TAKE 1 CAPSULE BY MOUTH ONCE DAILY AFTER BREAKFAST, Disp: 90 capsule, Rfl: 0 .  cephALEXin (KEFLEX) 500 MG capsule, Take 1 capsule (500 mg total) by mouth 3 (three) times daily for 5 days., Disp: 15 capsule, Rfl: 0  Allergies Patient has no known allergies.  Family History  Problem Relation Age of Onset  . Heart disease Mother   . Heart attack Father   . Heart disease Father     Social History Social  History   Tobacco Use  . Smoking status: Former Smoker    Types: Cigarettes    Quit date: 1997    Years since quitting: 24.0  . Smokeless tobacco: Never Used  Substance Use Topics  . Alcohol use: No    Alcohol/week: 0.0 standard drinks  . Drug use: No    Review of Systems Constitutional: No fever ENT: No sore throat. Cardiovascular: Denies chest pain. Respiratory: Denies shortness of breath. Skin: Positive laceration. Neurological: Negative for focal weakness or numbness.   ____________________________________________   PHYSICAL EXAM:  VITAL SIGNS: ED Triage Vitals  Enc Vitals Group     BP 02/11/19 1400 (!) 125/91     Pulse Rate 02/11/19 1400 100     Resp 02/11/19 1400 16     Temp 02/11/19 1400 98 F (36.7 C)     Temp Source 02/11/19 1400 Oral     SpO2 02/11/19 1400 98 %     Weight 02/11/19 1358 185 lb (83.9 kg)     Height 02/11/19 1358 '5\' 7"'  (1.702 m)     Head Circumference --      Peak Flow --      Pain Score 02/11/19 1358 5     Pain Loc --      Pain Edu? --      Excl. in [place]? --     Constitutional: Alert and oriented. Well appearing and in no acute distress. Eyes: Conjunctivae are normal.  ENT      Head: Normocephalic and atraumatic. Cardiovascular: Good peripheral circulation. Respiratory: Normal respiratory effort without tachypnea nor retractions.  Musculoskeletal:Steady gait.  Neurologic:  Normal speech and language.  Skin:  Skin is warm, dry.  Except: 3 cm irregular flap-like laceration present to left hand thumb web spacing with mild direct tenderness, no foreign body visualized, minimal active bleeding, no clear direct bony tenderness, able to fully grip and extend fingers including good distal resisted flexion and extension to thumb and index finger without deficit noted, left hand normal distal sensation.  Distal radial pulses equal bilaterally. Psychiatric: Mood and affect are normal. Speech and behavior are normal. Patient exhibits appropriate  insight and judgment   ___________________________________________   LABS (all labs ordered are listed, but only abnormal results are displayed)  Labs Reviewed - No data to display ____________________________________________  RADIOLOGY  DG Hand 2 View Left  Result Date: 02/11/2019 CLINICAL DATA:  Left hand laceration with drill. Thenar soft tissue laceration and pain. Initial encounter. EXAM: LEFT HAND - 2 VIEW COMPARISON:  None. FINDINGS: No evidence of fracture or dislocation. No evidence of radiopaque foreign body. A small lucent lesion is seen in the 2nd metacarpal shaft, consistent with a tiny enchondroma. 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 IMPRESSION: No evidence of fracture or radiopaque foreign body. Index finger osteoarthritis. Electronically Signed   By: Jenny Reichmann  Tereso Newcomer M.D.   On: 02/11/2019 14:41   ____________________________________________   PROCEDURES Procedures   Procedure(s) performed:  Procedure explained and verbal consent obtained. Consent: Verbal consent obtained. Written consent not obtained. Risks and benefits: risks, benefits and alternatives were discussed Patient identity confirmed: verbally with patient and hospital-assigned identification number  Consent given by: patient   Laceration Repair Location: Left hand Length: 3 cm Foreign bodies: no foreign bodies Tendon involvement: none Nerve involvement: none Preparation: Patient was prepped and draped in the usual sterile fashion. Anesthesia with 1% Lidocaine 4 mls Cleaned with Betadine  irrigation solution: Sterile water Irrigation method: jet lavage Amount of cleaning: copious Repaired with 5-0 nylon Number of sutures: 4 Technique: simple interrupted  Approximation: loose Patient tolerate well. Wound well approximated post repair.  Antibiotic ointment and dressing applied.  Wound care instructions provided.  Observe for any signs of infection or  other problems.     INITIAL IMPRESSION / ASSESSMENT AND PLAN / ED COURSE  Pertinent labs & imaging results that were available during my care of the patient were reviewed by me and considered in my medical decision making (see chart for details).  Well-appearing patient.  No acute distress.  Left hand laceration mechanical injury that occurred just prior to arrival.  Reports tetanus immunizations up-to-date.  Left hand x-ray as above per radiologist, no fracture or radiopaque foreign body.  Wound copiously cleaned, irrigated and repaired.  Patient is diabetic.  Will prophylactically place on oral Keflex.  Discussed wound care, over-the-counter topical antibiotic ointment and keeping clean.  Sutures removal in 10 days return to urgent care or follow-up with primary.  Discussed sooner return parameters.Discussed indication, risks and benefits of medications with patient.    Discussed follow up and return parameters including no resolution or any worsening concerns. Patient verbalized understanding and agreed to plan.   ____________________________________________   FINAL CLINICAL IMPRESSION(S) / ED DIAGNOSES  Final diagnoses:  Laceration of left hand without foreign body, initial encounter     ED Discharge Orders         Ordered    cephALEXin (KEFLEX) 500 MG capsule  3 times daily     02/11/19 1521           Note: This dictation was prepared with Dragon dictation along with smaller phrase technology. Any transcriptional errors that result from this process are unintentional.         Marylene Land, NP 02/11/19 1700

## 2019-02-11 NOTE — ED Triage Notes (Signed)
Patient states he was working on his shed and hit his hand with a drill. Area in between his left thumb and index finger. States that this happened around 1 hour ago.

## 2019-02-13 ENCOUNTER — Other Ambulatory Visit: Payer: Self-pay

## 2019-02-13 ENCOUNTER — Ambulatory Visit (INDEPENDENT_AMBULATORY_CARE_PROVIDER_SITE_OTHER): Payer: BC Managed Care – PPO | Admitting: Family Medicine

## 2019-02-13 ENCOUNTER — Encounter: Payer: Self-pay | Admitting: Family Medicine

## 2019-02-13 VITALS — BP 130/80 | HR 72 | Ht 67.0 in | Wt 184.0 lb

## 2019-02-13 DIAGNOSIS — E782 Mixed hyperlipidemia: Secondary | ICD-10-CM

## 2019-02-13 DIAGNOSIS — E1140 Type 2 diabetes mellitus with diabetic neuropathy, unspecified: Secondary | ICD-10-CM

## 2019-02-13 DIAGNOSIS — N400 Enlarged prostate without lower urinary tract symptoms: Secondary | ICD-10-CM | POA: Diagnosis not present

## 2019-02-13 DIAGNOSIS — I10 Essential (primary) hypertension: Secondary | ICD-10-CM | POA: Diagnosis not present

## 2019-02-13 DIAGNOSIS — Z23 Encounter for immunization: Secondary | ICD-10-CM

## 2019-02-13 MED ORDER — ATORVASTATIN CALCIUM 40 MG PO TABS: 40.0000 mg | ORAL_TABLET | Freq: Every day | ORAL | 1 refills | Status: DC

## 2019-02-13 MED ORDER — LISINOPRIL 20 MG PO TABS: 20.0000 mg | ORAL_TABLET | Freq: Every day | ORAL | 1 refills | Status: DC

## 2019-02-13 MED ORDER — METFORMIN HCL 1000 MG PO TABS: 1000.0000 mg | ORAL_TABLET | Freq: Two times a day (BID) | ORAL | 1 refills | Status: DC

## 2019-02-13 MED ORDER — PIOGLITAZONE HCL 15 MG PO TABS: 15.0000 mg | ORAL_TABLET | Freq: Every day | ORAL | 1 refills | Status: DC

## 2019-02-13 MED ORDER — AMLODIPINE BESYLATE 5 MG PO TABS: 5.0000 mg | ORAL_TABLET | Freq: Every day | ORAL | 1 refills | Status: DC

## 2019-02-13 MED ORDER — GLIPIZIDE ER 10 MG PO TB24: 10.0000 mg | ORAL_TABLET | Freq: Every day | ORAL | 1 refills | Status: DC

## 2019-02-13 MED ORDER — TAMSULOSIN HCL 0.4 MG PO CAPS: ORAL_CAPSULE | ORAL | 1 refills | Status: DC

## 2019-02-13 NOTE — Patient Instructions (Signed)

## 2019-02-13 NOTE — Progress Notes (Signed)
Date:  02/13/2019   Name:  Melvin BLOWE Sr.   DOB:  [birth year]   MRN:  [id number]   Chief Complaint: Diabetes (needs foot exam), Hyperlipidemia, Hypertension, and Benign Prostatic Hypertrophy  Diabetes He presents for his follow-up diabetic visit. He has type 2 diabetes mellitus. His disease course has been stable. There are no hypoglycemic associated symptoms. Pertinent negatives for hypoglycemia include no dizziness, headaches, nervousness/anxiousness or sweats. Pertinent negatives for diabetes include no blurred vision, no chest pain, no fatigue, no foot paresthesias, no foot ulcerations, no polydipsia, no polyphagia, no polyuria, no visual change, no weakness and no weight loss. There are no hypoglycemic complications. Symptoms are stable. There are no diabetic complications. Pertinent negatives for diabetic complications include no CVA, PVD or retinopathy. Risk factors for coronary artery disease include hypertension, male sex and dyslipidemia. Current diabetic treatment includes oral agent (triple therapy). He is compliant with treatment most of the time. His weight is stable. Meal planning includes avoidance of concentrated sweets and carbohydrate counting. He participates in exercise intermittently. His home blood glucose trend is fluctuating minimally. His breakfast blood glucose is taken between 8-9 am. His breakfast blood glucose range is generally 90-110 mg/dl. An ACE inhibitor/angiotensin II receptor blocker is being taken.  Hyperlipidemia This is a chronic problem. The current episode started more than 1 year ago. Exacerbating diseases include diabetes. He has no history of chronic renal disease. Factors aggravating his hyperlipidemia include thiazides. Pertinent negatives include no chest pain, focal sensory loss, focal weakness, leg pain, myalgias or shortness of breath. Current antihyperlipidemic treatment includes statins. The current treatment provides moderate improvement of  lipids. There are no compliance problems.  Risk factors for coronary artery disease include dyslipidemia, diabetes mellitus, hypertension and male sex.  Hypertension This is a chronic problem. The current episode started more than 1 year ago. The problem has been waxing and waning since onset. The problem is controlled. Pertinent negatives include no anxiety, blurred vision, chest pain, headaches, malaise/fatigue, neck pain, orthopnea, palpitations, peripheral edema, PND, shortness of breath or sweats. There are no associated agents to hypertension. Risk factors for coronary artery disease include obesity and dyslipidemia. Past treatments include calcium channel blockers and ACE inhibitors. The current treatment provides moderate improvement. There are no compliance problems.  There is no history of angina, kidney disease, CAD/MI, CVA, heart failure, left ventricular hypertrophy, PVD or retinopathy. There is no history of chronic renal disease, a hypertension causing med or renovascular disease.  Benign Prostatic Hypertrophy This is a chronic problem. The current episode started more than 1 year ago. The problem has been waxing and waning since onset. Irritative symptoms do not include frequency, nocturia or urgency. Obstructive symptoms do not include an intermittent stream. Pertinent negatives include no chills, dysuria, hematuria, hesitancy or nausea. Past treatments include tamsulosin. The treatment provided moderate relief.    Lab Results  Component Value Date   CREATININE 1.34 (H) 06/10/2018   BUN 17 06/10/2018   NA 137 06/10/2018   K 4.4 06/10/2018   CL 97 06/10/2018   CO2 23 06/10/2018   Lab Results  Component Value Date   CHOL 192 06/10/2018   HDL 48 06/10/2018   LDLCALC 105 (H) 06/10/2018   TRIG 196 (H) 06/10/2018   CHOLHDL 4.0 06/10/2018   Lab Results  Component Value Date   TSH 1.180 07/12/2015   Lab Results  Component Value Date   HGBA1C 7.8 (H) 08/29/2018     Review of  Systems  Constitutional:  Negative for chills, fatigue, fever, malaise/fatigue and weight loss.  HENT: Negative for drooling, ear discharge, ear pain and sore throat.   Eyes: Negative for blurred vision.  Respiratory: Negative for cough, shortness of breath and wheezing.   Cardiovascular: Negative for chest pain, palpitations, orthopnea, leg swelling and PND.  Gastrointestinal: Negative for abdominal pain, blood in stool, constipation, diarrhea and nausea.  Endocrine: Negative for polydipsia, polyphagia and polyuria.  Genitourinary: Negative for dysuria, frequency, hematuria, hesitancy, nocturia and urgency.  Musculoskeletal: Negative for back pain, myalgias and neck pain.  Skin: Negative for rash.  Allergic/Immunologic: Negative for environmental allergies.  Neurological: Negative for dizziness, focal weakness, weakness and headaches.  Hematological: Does not bruise/bleed easily.  Psychiatric/Behavioral: Negative for suicidal ideas. The patient is not nervous/anxious.     Patient Active Problem List   Diagnosis Date Noted  . Benign prostatic hyperplasia without lower urinary tract symptoms 06/21/2017  . Special screening for malignant neoplasms, colon   . Benign neoplasm of transverse colon   . Benign neoplasm of ascending colon   . Obesity, Class I, BMI 30-34.9 07/12/2015  . Overweight 04/12/2015  . Left carotid stenosis 04/12/2015  . Tinea corporis 10/03/2014  . Type 2 diabetes mellitus with diabetic neuropathy, without long-term current use of insulin ([place]) 04/26/2014  . HLD (hyperlipidemia) 04/26/2014  . HTN (hypertension) 04/26/2014  . H/O splenectomy 04/26/2014  . Anterior circulation transient ischemic attack 04/26/2014  . WPW (Wolff-Parkinson-White syndrome) 07/11/2012    No Known Allergies  Past Surgical History:  Procedure Laterality Date  . CARDIAC CATHETERIZATION     over 20 yrs ago.  no issues per pt.  . COLONOSCOPY WITH PROPOFOL N/A 02/12/2017   Procedure:  COLONOSCOPY WITH PROPOFOL;  Surgeon: Lucilla Lame, MD;  Location: [place];  Service: Endoscopy;  Laterality: N/A;  Diabetic - oral meds  . FOOT SURGERY    . HERNIA REPAIR     x2  . POLYPECTOMY  02/12/2017   Procedure: POLYPECTOMY;  Surgeon: Lucilla Lame, MD;  Location: [place];  Service: Endoscopy;;  . SPLENECTOMY      Social History   Tobacco Use  . Smoking status: Former Smoker    Types: Cigarettes    Quit date: 1997    Years since quitting: 24.1  . Smokeless tobacco: Never Used  Substance Use Topics  . Alcohol use: No    Alcohol/week: 0.0 standard drinks  . Drug use: No     Medication list has been reviewed and updated.  Current Meds  Medication Sig  . amLODipine (NORVASC) 5 MG tablet Take 1 tablet (5 mg total) by mouth daily.  Marland Kitchen aspirin 81 MG tablet Take 81 mg by mouth daily.  Marland Kitchen atorvastatin (LIPITOR) 40 MG tablet Take 1 tablet (40 mg total) by mouth daily.  Marland Kitchen BAYER CONTOUR TEST test strip USE ONE STRIP TO CHECK GLUCOSE ONCE DAILY  . blood glucose meter kit and supplies Dispense based on patient and insurance preference. Use up to four times daily as directed. (FOR ICD-9 250.00, 250.01).  . cephALEXin (KEFLEX) 500 MG capsule Take 1 capsule (500 mg total) by mouth 3 (three) times daily for 5 days.  Marland Kitchen glipiZIDE (GLUCOTROL XL) 10 MG 24 hr tablet Take 1 tablet (10 mg total) by mouth daily with breakfast.  . lisinopril (ZESTRIL) 20 MG tablet Take 1 tablet (20 mg total) by mouth daily.  . metFORMIN (GLUCOPHAGE) 1000 MG tablet Take 1 tablet (1,000 mg total) by mouth 2 (two) times daily.  Marland Kitchen  MICROLET LANCETS MISC USE ONE  TO CHECK GLUCOSE ONCE DAILY  . pioglitazone (ACTOS) 15 MG tablet Take 1 tablet (15 mg total) by mouth daily.  . tamsulosin (FLOMAX) 0.4 MG CAPS capsule One a day  . [DISCONTINUED] amLODipine (NORVASC) 5 MG tablet Take 1 tablet (5 mg total) by mouth daily.  . [DISCONTINUED] atorvastatin (LIPITOR) 40 MG tablet Take 1 tablet by mouth once daily   . [DISCONTINUED] glipiZIDE (GLUCOTROL XL) 10 MG 24 hr tablet Take 1 tablet (10 mg total) by mouth daily with breakfast.  . [DISCONTINUED] lisinopril (ZESTRIL) 20 MG tablet Take 1 tablet by mouth once daily  . [DISCONTINUED] metFORMIN (GLUCOPHAGE) 1000 MG tablet Take 1 tablet (1,000 mg total) by mouth 2 (two) times daily.  . [DISCONTINUED] pioglitazone (ACTOS) 15 MG tablet Take 1 tablet by mouth once daily  . [DISCONTINUED] tamsulosin (FLOMAX) 0.4 MG CAPS capsule TAKE 1 CAPSULE BY MOUTH ONCE DAILY AFTER BREAKFAST    PHQ 2/9 Scores 08/29/2018 06/10/2018 12/24/2017 06/21/2017  PHQ - 2 Score 0 0 0 0  PHQ- 9 Score - 0 - 0    BP Readings from Last 3 Encounters:  02/13/19 130/80  02/11/19 (!) 125/91  08/29/18 122/78    Physical Exam Vitals and nursing note reviewed.  HENT:     Head: Normocephalic.     Right Ear: Tympanic membrane, ear canal and external ear normal.     Left Ear: Tympanic membrane, ear canal and external ear normal.     Nose: Nose normal. No congestion or rhinorrhea.     Right Turbinates: Not enlarged.     Left Turbinates: Not enlarged.     Mouth/Throat:     Lips: Pink.     Mouth: Mucous membranes are moist.     Palate: No mass and lesions.     Pharynx: Oropharynx is clear. Uvula midline.  Eyes:     General: Lids are normal. No scleral icterus.       Right eye: No discharge.        Left eye: No discharge.     Extraocular Movements: Extraocular movements intact.     Right eye: Normal extraocular motion.     Left eye: Normal extraocular motion.     Conjunctiva/sclera: Conjunctivae normal.     Pupils: Pupils are equal, round, and reactive to light.  Neck:     Thyroid: No thyromegaly.     Vascular: Normal carotid pulses. No carotid bruit, hepatojugular reflux or JVD.     Trachea: Trachea and phonation normal. No tracheal deviation.  Cardiovascular:     Rate and Rhythm: Normal rate and regular rhythm.     Pulses:          Dorsalis pedis pulses are 2+ on the right  side and 2+ on the left side.       Posterior tibial pulses are 2+ on the right side and 2+ on the left side.     Heart sounds: Normal heart sounds, S1 normal and S2 normal. No murmur. No systolic murmur. No diastolic murmur. No friction rub. No gallop. No S3 or S4 sounds.   Pulmonary:     Effort: No respiratory distress.     Breath sounds: Normal breath sounds. No decreased breath sounds, wheezing, rhonchi or rales.  Abdominal:     General: Bowel sounds are normal.     Palpations: Abdomen is soft. There is no hepatomegaly, splenomegaly or mass.     Tenderness: There is no abdominal tenderness. There is no  right CVA tenderness, left CVA tenderness, guarding or rebound.  Genitourinary:    Prostate: Normal. Not enlarged, not tender and no nodules present.     Rectum: Normal. Guaiac result negative. No mass.  Musculoskeletal:        General: No tenderness. Normal range of motion.     Cervical back: Normal range of motion and neck supple.  Feet:     Right foot:     Protective Sensation: 10 sites tested. 10 sites sensed.     Skin integrity: Skin integrity normal. No ulcer, blister, skin breakdown, erythema, warmth, callus or dry skin.     Left foot:     Protective Sensation: 10 sites tested. 10 sites sensed.     Skin integrity: Skin integrity normal. No ulcer, blister, skin breakdown, erythema, warmth, callus or dry skin.  Lymphadenopathy:     Cervical: No cervical adenopathy.     Right cervical: No superficial cervical adenopathy.    Left cervical: No superficial cervical adenopathy.  Skin:    General: Skin is warm.     Capillary Refill: Capillary refill takes less than 2 seconds.     Findings: No rash.  Neurological:     Mental Status: He is alert and oriented to person, place, and time.     Cranial Nerves: No cranial nerve deficit.     Deep Tendon Reflexes: Reflexes are normal and symmetric.     Wt Readings from Last 3 Encounters:  02/13/19 184 lb (83.5 kg)  02/11/19 185 lb  (83.9 kg)  08/29/18 178 lb (80.7 kg)    BP 130/80   Pulse 72   Ht '5\' 7"'  (1.702 m)   Wt 184 lb (83.5 kg)   BMI 28.82 kg/m   Assessment and Plan: 1. Benign prostatic hyperplasia without lower urinary tract symptoms Chronic.  Controlled.  Stable.  Continue tamsulosin 0.4 mg daily.  Will check PSA.  DRE exam today is unremarkable in size shape or consistency. - PSA - tamsulosin (FLOMAX) 0.4 MG CAPS capsule; One a day  Dispense: 90 capsule; Refill: 1  2. Type 2 diabetes mellitus with diabetic neuropathy, without long-term current use of insulin (HCC) Chronic.  Controlled.  Stable.  Will continue current regimen of Metformin 1 g twice a day glipizide 10 mg once a day XL and pioglitazone 15 mg once a day.  Will check A1c with c-Met and microalbuminuria.  Will recheck in 6 months. - HgB A1c - Comprehensive Metabolic Panel (CMET) - Microalbumin, urine - metFORMIN (GLUCOPHAGE) 1000 MG tablet; Take 1 tablet (1,000 mg total) by mouth 2 (two) times daily.  Dispense: 180 tablet; Refill: 1 - glipiZIDE (GLUCOTROL XL) 10 MG 24 hr tablet; Take 1 tablet (10 mg total) by mouth daily with breakfast.  Dispense: 90 tablet; Refill: 1  - pioglitazone (ACTOS) 15 MG tablet; Take 1 tablet (15 mg total) by mouth daily.  Dispense: 90 tablet; Refill: 1  3. Essential hypertension Chronic.  Controlled.  Stable.  Will check CMP.  Will continue lisinopril 20 mg once a day and amlodipine 5 mg once a day.  And will check in 6 months. - Comprehensive Metabolic Panel (CMET) - lisinopril (ZESTRIL) 20 MG tablet; Take 1 tablet (20 mg total) by mouth daily.  Dispense: 90 tablet; Refill: 1  4. Mixed hyperlipidemia Chronic.  Controlled.  Stable.  Patient will continue atorvastatin 40 mg once a day.  Will check lipid panel. - Lipid Panel With LDL/HDL Ratio - atorvastatin (LIPITOR) 40 MG tablet; Take 1 tablet (  40 mg total) by mouth daily.  Dispense: 90 tablet; Refill: 1  5. Flu vaccine need Discussed and administered - Flu  Vaccine QUAD 6+ mos PF IM (Fluarix Quad PF)

## 2019-02-14 LAB — COMPREHENSIVE METABOLIC PANEL
ALT: 22 IU/L (ref 0–44)
AST: 14 IU/L (ref 0–40)
Albumin/Globulin Ratio: 1.6 (ref 1.2–2.2)
Albumin: 4.3 g/dL (ref 3.8–4.9)
Alkaline Phosphatase: 73 IU/L (ref 39–117)
BUN/Creatinine Ratio: 11 (ref 9–20)
BUN: 12 mg/dL (ref 6–24)
Bilirubin Total: 0.4 mg/dL (ref 0.0–1.2)
CO2: 24 mmol/L (ref 20–29)
Calcium: 10.2 mg/dL (ref 8.7–10.2)
Chloride: 100 mmol/L (ref 96–106)
Creatinine, Ser: 1.11 mg/dL (ref 0.76–1.27)
GFR calc Af Amer: 84 mL/min/{1.73_m2} (ref >59)
GFR calc non Af Amer: 72 mL/min/{1.73_m2} (ref >59)
Globulin, Total: 2.7 g/dL (ref 1.5–4.5)
Glucose: 103 mg/dL — ABNORMAL HIGH (ref 65–99)
Potassium: 5.1 mmol/L (ref 3.5–5.2)
Sodium: 138 mmol/L (ref 134–144)
Total Protein: 7 g/dL (ref 6.0–8.5)

## 2019-02-14 LAB — PSA: Prostate Specific Ag, Serum: 0.7 ng/mL (ref 0.0–4.0)

## 2019-02-14 LAB — HEMOGLOBIN A1C
Est. average glucose Bld gHb Est-mCnc: 197 mg/dL
Hgb A1c MFr Bld: 8.5 % — ABNORMAL HIGH (ref 4.8–5.6)

## 2019-02-14 LAB — LIPID PANEL WITH LDL/HDL RATIO
Cholesterol, Total: 153 mg/dL (ref 100–199)
HDL: 55 mg/dL (ref >39)
LDL Chol Calc (NIH): 77 mg/dL (ref 0–99)
LDL/HDL Ratio: 1.4 ratio (ref 0.0–3.6)
Triglycerides: 115 mg/dL (ref 0–149)
VLDL Cholesterol Cal: 21 mg/dL (ref 5–40)

## 2019-02-14 LAB — MICROALBUMIN, URINE: Microalbumin, Urine: <3 ug/mL

## 2019-02-16 ENCOUNTER — Other Ambulatory Visit: Payer: Self-pay

## 2019-02-16 DIAGNOSIS — E1140 Type 2 diabetes mellitus with diabetic neuropathy, unspecified: Secondary | ICD-10-CM

## 2019-02-16 NOTE — Progress Notes (Unsigned)
Ref endo

## 2019-03-26 DIAGNOSIS — U071 COVID-19: Secondary | ICD-10-CM | POA: Diagnosis not present

## 2019-03-26 DIAGNOSIS — Z03818 Encounter for observation for suspected exposure to other biological agents ruled out: Secondary | ICD-10-CM | POA: Diagnosis not present

## 2019-03-26 DIAGNOSIS — Z20828 Contact with and (suspected) exposure to other viral communicable diseases: Secondary | ICD-10-CM | POA: Diagnosis not present

## 2019-03-28 ENCOUNTER — Ambulatory Visit (INDEPENDENT_AMBULATORY_CARE_PROVIDER_SITE_OTHER)
Admit: 2019-03-28 | Discharge: 2019-03-28 | Disposition: A | Discharge status: Home / Self Care | Payer: BC Managed Care – PPO | Attending: Urgent Care | Admitting: Urgent Care

## 2019-03-28 ENCOUNTER — Other Ambulatory Visit: Payer: Self-pay

## 2019-03-28 ENCOUNTER — Telehealth: Payer: Self-pay

## 2019-03-28 ENCOUNTER — Ambulatory Visit
Admission: EM | Admit: 2019-03-28 | Discharge: 2019-03-28 | Disposition: A | Discharge status: Home / Self Care | Payer: BC Managed Care – PPO | Attending: Urgent Care | Admitting: Urgent Care

## 2019-03-28 ENCOUNTER — Ambulatory Visit (INDEPENDENT_AMBULATORY_CARE_PROVIDER_SITE_OTHER): Payer: BC Managed Care – PPO

## 2019-03-28 DIAGNOSIS — R05 Cough: Secondary | ICD-10-CM

## 2019-03-28 DIAGNOSIS — R0602 Shortness of breath: Secondary | ICD-10-CM | POA: Diagnosis not present

## 2019-03-28 DIAGNOSIS — R918 Other nonspecific abnormal finding of lung field: Secondary | ICD-10-CM | POA: Diagnosis not present

## 2019-03-28 DIAGNOSIS — Z7189 Other specified counseling: Secondary | ICD-10-CM

## 2019-03-28 DIAGNOSIS — U071 COVID-19: Secondary | ICD-10-CM

## 2019-03-28 DIAGNOSIS — J1282 Pneumonia due to coronavirus disease 2019: Secondary | ICD-10-CM

## 2019-03-28 MED ORDER — AZITHROMYCIN 250 MG PO TABS: 250.0000 mg | ORAL_TABLET | Freq: Every day | ORAL | 0 refills | Status: DC

## 2019-03-28 MED ORDER — PREDNISONE 20 MG PO TABS: 40.0000 mg | ORAL_TABLET | Freq: Every day | ORAL | 0 refills | Status: DC

## 2019-03-28 MED ORDER — BENZONATATE 100 MG PO CAPS: 100.0000 mg | ORAL_CAPSULE | Freq: Three times a day (TID) | ORAL | 0 refills | Status: DC

## 2019-03-28 NOTE — Telephone Encounter (Signed)
Pt's wife called stating that pt tested positive for covid on 3/14- I saw the positive test in care everywhere. He is still running fevers and coughing. Not really any production that he can speak of. He has been advised to go to the urgent care for chest xray, pulse ox and further evaluation. Told pt to call when arrives in parking lot, to let them know he was there and tested positive.

## 2019-03-28 NOTE — Discharge Instructions (Addendum)
It was very nice seeing you today in clinic. Thank you for entrusting me with your care.   Rest and increase fluid intake. Use medications as prescribed. May use Tylenol and/or Ibuprofen as needed for pain/fever.  Make arrangements to follow up with your regular doctor in 1 week for re-evaluation if not improving. If your symptoms/condition worsens, please seek follow up care either here or in the ER. Please remember, our [place] providers are "right here with you" when you need [place].   Again, it was my pleasure to take care of you today. Thank you for choosing our clinic. I hope that you start to feel better quickly.   Honor Loh, MSN, APRN, FNP-C, CEN Advanced Practice Provider [facility]

## 2019-03-28 NOTE — ED Notes (Signed)
Prior Auth Approval code order # [id number] valid from 03/28/19-09/23/19

## 2019-03-28 NOTE — ED Triage Notes (Signed)
Tested positive for COVID 03/26/2019.  Reports fatigue, coughing, body aches.  Some SOB when ambulating.  Referred by PCP for further evaluation for pneumonia.  Took Aspirin this morning.

## 2019-03-30 NOTE — ED Provider Notes (Addendum)
[place]   Name: Melvin DULAY Sr. DOB: 1960/03/28 MRN: [id number] CSN: [id number] PCP: Juline Patch, MD  Arrival date and time:  03/28/19 1220  Chief Complaint:  Fatigue, Cough, and Shortness of Breath  NOTE: Prior to seeing the patient today, I have reviewed the triage nursing documentation and vital signs. Clinical staff has updated patient's PMH/PSHx, current medication list, and drug allergies/intolerances to ensure comprehensive history available to assist in medical decision making.   History:   HPI: Melvin MELCHIOR Sr. is a 59 y.o. male who presents today with complaints of fatigue, general malaise, diffuse myalgias, cough, and worsening exertional dyspnea that started a few days ago. Symptoms have been progressive since diagnosis of SARS-CoV-2 (novel coronavirus) on 03/26/2019.  Patient has been running an elevated temperature, and reports that his Tmax has been within the 99 range. Cough has been non-productive with no associated wheezing. Patient notes that he "just gives out" (energy and breath) with minimal exertion. Presenting RA oxygen saturations 98% at rest. He denies that he has experienced any nausea, vomiting, diarrhea, or abdominal pain. He notes a decreased appetite overall, however There is no height or weight on file to calculate BMI.aintains the ability to tolerate oral fluids. Patient confirms perceived alterations to his sense of taste and smell. Patient has been vaccinated for influenza this season. Despite his symptoms, patient has not taken any over the counter interventions to help improve/relieve his reported symptoms at home. Patient has significant co-morbidities, including T2DM, TIA, HLD, HTN, and WPW syndrome. Patient contacted his PCP Melvin Ramp, MD) this morning to advise her of his worsening symptoms. Patient was directed to the urgent care for F2F assessment, VS monitoring, and consideration of imaging.   Past Medical History:  Diagnosis Date  . AK  (actinic keratosis)   . Diabetes mellitus without complication ([place])   . History of TIA (transient ischemic attack)    over 20 yrs ago. no deficits.  . Hypercholesteremia   . Hyperlipidemia   . Hypertension   . Kidney stones   . WPW (Wolff-Parkinson-White syndrome)    cardio note 07/11/12    Past Surgical History:  Procedure Laterality Date  . CARDIAC CATHETERIZATION     over 20 yrs ago.  no issues per pt.  . COLONOSCOPY WITH PROPOFOL N/A 02/12/2017   Procedure: COLONOSCOPY WITH PROPOFOL;  Surgeon: Melvin Lame, MD;  Location: [facility];  Service: Endoscopy;  Laterality: N/A;  Diabetic - oral meds  . FOOT SURGERY    . HERNIA REPAIR     x2  . POLYPECTOMY  02/12/2017   Procedure: POLYPECTOMY;  Surgeon: Melvin Lame, MD;  Location: [facility];  Service: Endoscopy;;  . SPLENECTOMY      Family History  Problem Relation Age of Onset  . Heart disease Mother   . Heart attack Father   . Heart disease Father     Social History   Tobacco Use  . Smoking status: Former Smoker    Types: Cigarettes    Quit date: 1997    Years since quitting: 24.2  . Smokeless tobacco: Never Used  Substance Use Topics  . Alcohol use: No    Alcohol/week: 0.0 standard drinks  . Drug use: No    Patient Active Problem List   Diagnosis Date Noted  . Benign prostatic hyperplasia without lower urinary tract symptoms 06/21/2017  . Special screening for malignant neoplasms, colon   . Benign neoplasm of transverse colon   . Benign neoplasm of  ascending colon   . Obesity, Class I, BMI 30-34.9 07/12/2015  . Overweight 04/12/2015  . Left carotid stenosis 04/12/2015  . Tinea corporis 10/03/2014  . Type 2 diabetes mellitus with diabetic neuropathy, without long-term current use of insulin ([place]) 04/26/2014  . HLD (hyperlipidemia) 04/26/2014  . HTN (hypertension) 04/26/2014  . H/O splenectomy 04/26/2014  . Anterior circulation transient ischemic attack 04/26/2014  . WPW  (Wolff-Parkinson-White syndrome) 07/11/2012    Home Medications:    Current Meds  Medication Sig  . amLODipine (NORVASC) 5 MG tablet Take 1 tablet (5 mg total) by mouth daily.  Marland Kitchen aspirin 81 MG tablet Take 81 mg by mouth daily.  Marland Kitchen atorvastatin (LIPITOR) 40 MG tablet Take 1 tablet (40 mg total) by mouth daily.  Marland Kitchen BAYER CONTOUR TEST test strip USE ONE STRIP TO CHECK GLUCOSE ONCE DAILY  . blood glucose meter kit and supplies Dispense based on patient and insurance preference. Use up to four times daily as directed. (FOR ICD-9 250.00, 250.01).  Marland Kitchen glipiZIDE (GLUCOTROL XL) 10 MG 24 hr tablet Take 1 tablet (10 mg total) by mouth daily with breakfast.  . lisinopril (ZESTRIL) 20 MG tablet Take 1 tablet (20 mg total) by mouth daily.  . metFORMIN (GLUCOPHAGE) 1000 MG tablet Take 1 tablet (1,000 mg total) by mouth 2 (two) times daily.  Marland Kitchen MICROLET LANCETS MISC USE ONE  TO CHECK GLUCOSE ONCE DAILY  . pioglitazone (ACTOS) 15 MG tablet Take 1 tablet (15 mg total) by mouth daily.  . tamsulosin (FLOMAX) 0.4 MG CAPS capsule One a day    Allergies:   Patient has no known allergies.  Review of Systems (ROS):  Review of systems NEGATIVE unless otherwise noted in narrative H&P section.   Vital Signs: Today's Vitals   03/28/19 1226 03/28/19 1229 03/28/19 1505  BP:  130/82   Pulse:  100   Resp:  18   Temp:  99.1 F (37.3 C)   TempSrc:  Oral   SpO2:  98%   PainSc: 0-No pain  0-No pain    Physical Exam: Physical Exam  Constitutional: He is oriented to person, place, and time and well-developed, well-nourished, and in no distress.  Acutely ill appearing; fatigued/listless.  HENT:  Head: Normocephalic and atraumatic.  Nose: Nose normal.  Mouth/Throat: Uvula is midline, oropharynx is clear and moist and mucous membranes are normal.  (+) anosmia and ageusia   Eyes: Pupils are equal, round, and reactive to light.  Cardiovascular: Normal rate, regular rhythm, normal heart sounds and intact distal  pulses.  Pulmonary/Chest: Effort normal. He has decreased breath sounds (throughout). He has wheezes (mild expiratory). He has rhonchi (upper airways; clears completely with cough).  Moderate non-productive cough noted in clinic. No SOB or increased WOB at rest. (+) DOE. No distress. Able to speak in complete sentences without difficulties. SPO2 98% on RA.  Musculoskeletal:     Cervical back: Normal range of motion and neck supple.  Neurological: He is alert and oriented to person, place, and time. Gait normal.  Skin: Skin is warm and dry. No rash noted. He is not diaphoretic.  Psychiatric: Mood, memory, affect and judgment normal.  Nursing note and vitals reviewed.   Urgent Care Treatments / Results:   Orders Placed This Encounter  Procedures  . DG Chest 2 View  . CT Chest Wo Contrast    LABS: PLEASE NOTE: all labs that were ordered this encounter are listed, however only abnormal results are displayed. Labs Reviewed - No data to  display  EKG: -None  RADIOLOGY: DG Chest 2 View  Result Date: 03/28/2019 CLINICAL DATA:  Shortness of breath and cough. Recent COVID-19 positive EXAM: CHEST - 2 VIEW COMPARISON:  None. FINDINGS: There is an ill-defined 1 x 0.8 cm nodular opacity in the left upper lobe, apparently overlying the posterior left sixth rib. The lungs are otherwise clear. The heart size and pulmonary vascularity are normal. No adenopathy. No bone lesions. IMPRESSION: Approximately 1 cm nodular opacity left upper lobe. Advise noncontrast enhanced chest CT to further evaluate. Lungs elsewhere clear. No adenopathy. These results will be called to the ordering clinician or representative by the Radiologist Assistant, and communication documented in the PACS or [employer]. Electronically Signed   By: Lowella Grip III M.D.   On: 03/28/2019 13:04   CT Chest Wo Contrast  Result Date: 03/28/2019 CLINICAL DATA:  Abnormal chest x-ray, lung nodule EXAM: CT CHEST WITHOUT CONTRAST  TECHNIQUE: Multidetector CT imaging of the chest was performed following the standard protocol without IV contrast. COMPARISON:  Chest radiograph, 03/28/2019, CT abdomen pelvis, 08/27/2015 FINDINGS: Cardiovascular: Minimal scattered aortic atherosclerosis. Normal heart size. No pericardial effusion. Mediastinum/Nodes: No enlarged mediastinal, hilar, or axillary lymph nodes. Thyroid gland, trachea, and esophagus demonstrate no significant findings. Lungs/Pleura: There are numerous, scattered bilateral ground-glass opacities throughout the lungs. No pleural effusion or pneumothorax. Upper Abdomen: No acute abnormality. Musculoskeletal: No chest wall mass or suspicious bone lesions identified. IMPRESSION: 1. Numerous, scattered bilateral ground-glass opacities throughout the lungs, nonspecific and infectious or inflammatory, generally in keeping with sequelae of reported COVID-19 airspace disease. Recommend follow-up in 3 months to ensure complete resolution. There are several opacities in the general vicinity of the described nodular opacity on prior radiographs. No solid pulmonary nodule identified. 2.  Aortic Atherosclerosis (ICD10-I70.0). Electronically Signed   By: Eddie Candle M.D.   On: 03/28/2019 14:47    PROCEDURES: Procedures  MEDICATIONS RECEIVED THIS VISIT: Medications - No data to display  PERTINENT CLINICAL COURSE NOTES/UPDATES:   Initial Impression / Assessment and Plan / Urgent Care Course:  Pertinent labs & imaging results that were available during my care of the patient were personally reviewed by me and considered in my medical decision making (see lab/imaging section of note for values and interpretations).  Melvin Delaine Sr. is a 59 y.o. male who presents to [facility] today with complaints of Fatigue, Cough, and Shortness of Breath  Patient acutely ill appearing (non-toxic) appearing in clinic today. He does not appear to be in any acute distress. Presenting symptoms  (see HPI) and exam as documented above. He presents with symptoms associated with SARS-CoV-2 (novel coronavirus). Patient tested positive for the virus x 2 days ago (03/26/2019). Discussed typical symptom constellation associated with SARS-CoV-2. Discussed that worsening shortness of breath is of concern with this virus as it can mean that it has progressed to pneumonia. If not monitored and treated properly, the pneumonia can lead to respiratory failure, sepsis, and even death. Discussed plans to proceed with diagnostic plain films to further assess today. Patient amenable.    Radiographs of the chest performed today revealed an ill-defined nodular opacity in the LUL. CT chest without contrast was recommended to further characterize the area of concern.   Results reviewed with patient. Discussed recommendations. Patient amenable to proceeding with further advanced imaging to assess findings noted on plain films.    CT of the chest without contrast performed. Imaging revealed  Multiple ground-glass opacities scattered throughout both lungs, with several areas  being in the area of concern in the LUL noted on plain films. Favored to be infectious versus inflammatory etiology.   Results reviewed with patient. Discussed that scan showed BILATERAL pneumonia that has developed as a result of his current SARS-CoV-2 infection. Reviewed plans for outpatient treatment, with the caveat being that any worsening of his symptoms will require urgent evaluation in a setting capable of providing a higher level of care (emergency department). Patient verbalizes understanding. Will proceed with outpatient treatment as follows:  Patient has multiple co-morbidities that increase his SARS-CoV-2 morbidity and mortality risk including T2DM and HTN. His co-morbidities should qualify him for treatment with the monoclonal antibody (bamlanivimab) infusion. Referral message left on provider line. Outpatient COVID response team to  determine eligibility and contact the patient to discuss if he is deemed to be eligible for the infusion treatment.   Will cover for possible bacterial etiology using a 5 day course of oral azithromycin. Patient is wheezing as well. Will send in a 5 day steroid burst (prednisone 40 mg daily). Patient has T2DM and notes CBGs are well controlled. Reviewed recent labs form 02/2019. Hgb A1c elevated at 8.5%. Considered benefits vs. Risks associated with anticipated short course glucocorticoid therapy. Discussed potential elevations in his glucose levels while on the steroids. Patient wishes to proceed. Encouraged to monitor CBGs closely.    Discussed supportive care measures at home during acute phase of illness. Patient to rest as much as possible. He was encouraged to ensure adequate hydration (water and ORS) to prevent dehydration and electrolyte derangements. Patient may use APAP and/or IBU on an as needed basis for pain/fever. Will send in a supply of benzonatate (Tessalon) for patient to use on a PRN basis to help with his cough.    Current clinical condition warrants patient being out of work in order to quarantine while recovering from SARS-CoV-2 illness. He is already out of work following his initial diagnosis. Patient advised to follow previously provided recommendations regarding need to remain on quarantine until he has met the Centers for Disease Control and Prevention (CDC) "Criteria for Ending Self-Isolation" which includes all of the following: . at least 10 days since symptoms onset . AND 3 days fever free without antipyretics (Tylenol or Ibuprofen) . AND improvement in respiratory symptoms. These measures are being implemented out of an abundance of caution to prevent transmission and spread during the current SARS-CoV-2 pandemic.  Discussed follow up with primary care physician in 1 week for re-evaluation. I have reviewed the follow up and strict return precautions for any new or  worsening symptoms. Patient is aware of symptoms that would be deemed urgent/emergent, and would thus require further evaluation either here or in the emergency department. At the time of discharge, he verbalized understanding and consent with the discharge plan as it was reviewed with him. All questions were fielded by provider and/or clinic staff prior to patient discharge.    Final Clinical Impressions / Urgent Care Diagnoses:   Final diagnoses:  Pneumonia due to COVID-19 virus  Ground glass opacity present on imaging of lung  Advice given about COVID-19 virus infection    New Prescriptions:  [place] Controlled Substance Registry consulted? Not Applicable  Meds ordered this encounter  Medications  . azithromycin (ZITHROMAX) 250 MG tablet    Sig: Take 1 tablet (250 mg total) by mouth daily. Take first 2 tablets together, then 1 every day until finished.    Dispense:  6 tablet    Refill:  0  Dx: R05  . predniSONE (DELTASONE) 20 MG tablet    Sig: Take 2 tablets (40 mg total) by mouth daily.    Dispense:  10 tablet    Refill:  0  . benzonatate (TESSALON) 100 MG capsule    Sig: Take 1 capsule (100 mg total) by mouth every 8 (eight) hours.    Dispense:  21 capsule    Refill:  0    Recommended Follow up Care:  Patient encouraged to follow up with the following provider within the specified time frame, or sooner as dictated by the severity of his symptoms. As always, he was instructed that for any urgent/emergent care needs, he should seek care either here or in the emergency department for more immediate evaluation.  Follow-up Information    Juline Patch, MD In 1 week.   Specialty: Family Medicine Why: General reassessment of symptoms if not improving Contact information: [address] [place] [address] [phone]         NOTE: This note was prepared using Dragon dictation software along with smaller phrase technology. Despite my best ability to proofread,  there is the potential that transcriptional errors may still occur from this process, and are completely unintentional.    Karen Kitchens, NP 03/30/19 2205

## 2019-03-31 ENCOUNTER — Telehealth: Payer: Self-pay

## 2019-03-31 NOTE — Telephone Encounter (Signed)
Horris Latino - pts wife called saying patient went to [facility] and tested Positive for Covid on 03/28/19.  Tried calling pt back from her VM but she did not answer. Left her a VM to call the office back.   CM

## 2019-04-04 ENCOUNTER — Other Ambulatory Visit: Payer: Self-pay

## 2019-04-04 ENCOUNTER — Ambulatory Visit (INDEPENDENT_AMBULATORY_CARE_PROVIDER_SITE_OTHER): Payer: BC Managed Care – PPO | Admitting: Family Medicine

## 2019-04-04 ENCOUNTER — Encounter: Payer: Self-pay | Admitting: Family Medicine

## 2019-04-04 DIAGNOSIS — J189 Pneumonia, unspecified organism: Secondary | ICD-10-CM | POA: Diagnosis not present

## 2019-04-04 DIAGNOSIS — U071 COVID-19: Secondary | ICD-10-CM | POA: Diagnosis not present

## 2019-04-04 NOTE — Progress Notes (Addendum)
Date:  04/04/2019   Name:  Melvin CANDELARIA Sr.   DOB:  [birth year]   MRN:  [id number]   Chief Complaint: Follow-up (Dx covid on 14 and pneum. on 16th- still fatigue/ feeling better each day)  I connected withthis patient, Melvin Torres, by telephoneat the patient's home.  I verified that I am speaking with the correct person using two identifiers. This visit was conducted via telephone due to the Covid-19 outbreak from my office at [facility] in [place], [place]. I discussed the limitations, risks, security and privacy concerns of performing an evaluation and management service by telephone. I also discussed with the patient that there may be a patient responsible charge related to this service. The patient expressed understanding and agreed to proceed.                                                                                                                                                          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  Today patient continues to have some fatigue improving shortness of breath as well as cough.  It was discussed with patient that we will see him on the 29th for evaluation after he has completed his Z-Pak and will be 2 weeks beyond the initiation of his symptoms.  Patient currently is not febrile and seems to be gradually improving.  Pneumonia He complains of cough and shortness of breath. There is no chest tightness, difficulty breathing, frequent throat clearing, hemoptysis, hoarse voice, sputum production or wheezing. Chronicity: followup. The current episode started 1 to 4 weeks  ago. The problem occurs intermittently. The problem has been gradually improving. The cough is non-productive. Associated symptoms include malaise/fatigue. Pertinent negatives include no appetite change, chest pain, dyspnea on exertion, ear congestion, fever, myalgias, nasal congestion, postnasal drip, rhinorrhea, sore throat, sweats or trouble swallowing. His symptoms are alleviated by oral steroids (azithromycin). He reports moderate improvement on treatment. There is no history of asthma, bronchiectasis, bronchitis or COPD.    Lab Results  Component Value Date   CREATININE 1.11 02/13/2019   BUN 12 02/13/2019   NA 138 02/13/2019   K 5.1 02/13/2019   CL 100 02/13/2019   CO2 24 02/13/2019   Lab Results  Component Value Date   CHOL 153 02/13/2019   HDL 55 02/13/2019   LDLCALC 77 02/13/2019   TRIG 115 02/13/2019   CHOLHDL 4.0 06/10/2018  Lab Results  Component Value Date   TSH 1.180 07/12/2015   Lab Results  Component Value Date   HGBA1C 8.5 (H) 02/13/2019   Lab Results  Component Value Date   WBC 15.1 (H) 08/27/2015   HGB 15.6 08/27/2015   HCT 46.2 08/27/2015   MCV 91.9 08/27/2015   PLT 484 (H) 08/27/2015   Lab Results  Component Value Date   ALT 22 02/13/2019   AST 14 02/13/2019   ALKPHOS 73 02/13/2019   BILITOT 0.4 02/13/2019     Review of Systems  Constitutional: Positive for malaise/fatigue. Negative for activity change, appetite change, chills, diaphoresis, fatigue, fever and unexpected weight change.  HENT: Negative for congestion, hoarse voice, postnasal drip, rhinorrhea, sore throat and trouble swallowing.   Eyes: Negative for visual disturbance.  Respiratory: Positive for cough and shortness of breath. Negative for hemoptysis, sputum production and wheezing.   Cardiovascular: Negative for chest pain and dyspnea on exertion.  Musculoskeletal: Negative for myalgias.    Patient Active Problem List   Diagnosis Date Noted  . Benign prostatic hyperplasia  without lower urinary tract symptoms 06/21/2017  . Special screening for malignant neoplasms, colon   . Benign neoplasm of transverse colon   . Benign neoplasm of ascending colon   . Obesity, Class I, BMI 30-34.9 07/12/2015  . Overweight 04/12/2015  . Left carotid stenosis 04/12/2015  . Tinea corporis 10/03/2014  . Type 2 diabetes mellitus with diabetic neuropathy, without long-term current use of insulin ([place]) 04/26/2014  . HLD (hyperlipidemia) 04/26/2014  . HTN (hypertension) 04/26/2014  . H/O splenectomy 04/26/2014  . Anterior circulation transient ischemic attack 04/26/2014  . WPW (Wolff-Parkinson-White syndrome) 07/11/2012    No Known Allergies  Past Surgical History:  Procedure Laterality Date  . CARDIAC CATHETERIZATION     over 20 yrs ago.  no issues per pt.  . COLONOSCOPY WITH PROPOFOL N/A 02/12/2017   Procedure: COLONOSCOPY WITH PROPOFOL;  Surgeon: Lucilla Lame, MD;  Location: [facility];  Service: Endoscopy;  Laterality: N/A;  Diabetic - oral meds  . FOOT SURGERY    . HERNIA REPAIR     x2  . POLYPECTOMY  02/12/2017   Procedure: POLYPECTOMY;  Surgeon: Lucilla Lame, MD;  Location: [facility];  Service: Endoscopy;;  . SPLENECTOMY      Social History   Tobacco Use  . Smoking status: Former Smoker    Types: Cigarettes    Quit date: 1997    Years since quitting: 24.2  . Smokeless tobacco: Never Used  Substance Use Topics  . Alcohol use: No    Alcohol/week: 0.0 standard drinks  . Drug use: No     Medication list has been reviewed and updated.  Current Meds  Medication Sig  . amLODipine (NORVASC) 5 MG tablet Take 1 tablet (5 mg total) by mouth daily.  Marland Kitchen aspirin 81 MG tablet Take 81 mg by mouth daily.  Marland Kitchen atorvastatin (LIPITOR) 40 MG tablet Take 1 tablet (40 mg total) by mouth daily.  Marland Kitchen azithromycin (ZITHROMAX) 250 MG tablet Take 1 tablet (250 mg total) by mouth daily. Take first 2 tablets together, then 1 every day until finished.  Marland Kitchen BAYER CONTOUR  TEST test strip USE ONE STRIP TO CHECK GLUCOSE ONCE DAILY  . benzonatate (TESSALON) 100 MG capsule Take 1 capsule (100 mg total) by mouth every 8 (eight) hours.  . blood glucose meter kit and supplies Dispense based on patient and insurance preference. Use up to four times daily as directed. (FOR  ICD-9 250.00, 250.01).  Marland Kitchen glipiZIDE (GLUCOTROL XL) 10 MG 24 hr tablet Take 1 tablet (10 mg total) by mouth daily with breakfast.  . lisinopril (ZESTRIL) 20 MG tablet Take 1 tablet (20 mg total) by mouth daily.  . metFORMIN (GLUCOPHAGE) 1000 MG tablet Take 1 tablet (1,000 mg total) by mouth 2 (two) times daily.  Marland Kitchen MICROLET LANCETS MISC USE ONE  TO CHECK GLUCOSE ONCE DAILY  . pioglitazone (ACTOS) 15 MG tablet Take 1 tablet (15 mg total) by mouth daily.  . tamsulosin (FLOMAX) 0.4 MG CAPS capsule One a day    PHQ 2/9 Scores 04/04/2019 08/29/2018 06/10/2018 12/24/2017  PHQ - 2 Score 0 0 0 0  PHQ- 9 Score 0 - 0 -    BP Readings from Last 3 Encounters:  03/28/19 130/82  02/13/19 130/80  02/11/19 (!) 125/91    Physical Exam Nursing note reviewed.     Wt Readings from Last 3 Encounters:  02/13/19 184 lb (83.5 kg)  02/11/19 185 lb (83.9 kg)  08/29/18 178 lb (80.7 kg)    There were no vitals taken for this visit.  Assessment and Plan:  1. Pneumonia of both lungs due to infectious organism, unspecified part of lung Patient was exposed to positive Covid on three six and came down with symptoms 4 days later while on employment out-of-town.  Upon return patient tested positive on 3:14 AM progressed with coughing and fever and was evaluated in urgent care on 316.  Patient was initiated on azithromycin to 50 mg to the first day followed by one a day for 4 days.  Chest x-ray noted that patient had bilateral pneumonia consistent with Covid pneumonitis and was also started on prednisone.  Patient is gradually improving on this regimen and will be 2 weeks out on the 29th at which time we will evaluate patient  prior to return to work.  2. Lab test positive for detection of COVID-19 virus As noted patient tested positive but he will be 2 weeks out from initiation of an infection and confirmation with testing.  Patient will be reevaluated on March 29.  I spent 12 minutes with this patient, More than 50% of that time was spent in medical televisit education, counseling and care coordination.

## 2019-04-06 ENCOUNTER — Other Ambulatory Visit: Payer: Self-pay

## 2019-04-06 ENCOUNTER — Ambulatory Visit (INDEPENDENT_AMBULATORY_CARE_PROVIDER_SITE_OTHER): Payer: BC Managed Care – PPO | Admitting: Family Medicine

## 2019-04-06 ENCOUNTER — Encounter: Payer: Self-pay | Admitting: Family Medicine

## 2019-04-06 VITALS — BP 102/62 | HR 120 | Temp 98.8°F | Ht 67.0 in | Wt 178.0 lb

## 2019-04-06 DIAGNOSIS — J22 Unspecified acute lower respiratory infection: Secondary | ICD-10-CM

## 2019-04-06 DIAGNOSIS — U071 COVID-19: Secondary | ICD-10-CM

## 2019-04-06 DIAGNOSIS — J189 Pneumonia, unspecified organism: Secondary | ICD-10-CM

## 2019-04-06 NOTE — Progress Notes (Signed)
Date:  04/06/2019   Name:  Melvin CARRERAS Sr.   DOB:  10-Sep-1960   MRN:  [id number]   Chief Complaint: Follow-up (diagnosed with COVID 03/26/19 and diagnosed with pneumonia 03/28/19 no SOB needs chest x-ray and spo2 )  Cough This is a new problem. The current episode started 1 to 4 weeks ago. The problem has been gradually improving. The cough is non-productive. Associated symptoms include shortness of breath. Pertinent negatives include no chest pain, chills, ear pain, fever, headaches, myalgias, nasal congestion, postnasal drip, rash, rhinorrhea, sore throat, sweats, weight loss or wheezing. There is no history of environmental allergies.    Lab Results  Component Value Date   CREATININE 1.11 02/13/2019   BUN 12 02/13/2019   NA 138 02/13/2019   K 5.1 02/13/2019   CL 100 02/13/2019   CO2 24 02/13/2019   Lab Results  Component Value Date   CHOL 153 02/13/2019   HDL 55 02/13/2019   LDLCALC 77 02/13/2019   TRIG 115 02/13/2019   CHOLHDL 4.0 06/10/2018   Lab Results  Component Value Date   TSH 1.180 07/12/2015   Lab Results  Component Value Date   HGBA1C 8.5 (H) 02/13/2019   Lab Results  Component Value Date   WBC 15.1 (H) 08/27/2015   HGB 15.6 08/27/2015   HCT 46.2 08/27/2015   MCV 91.9 08/27/2015   PLT 484 (H) 08/27/2015   Lab Results  Component Value Date   ALT 22 02/13/2019   AST 14 02/13/2019   ALKPHOS 73 02/13/2019   BILITOT 0.4 02/13/2019     Review of Systems  Constitutional: Negative for chills, fever and weight loss.  HENT: Negative for drooling, ear discharge, ear pain, postnasal drip, rhinorrhea and sore throat.   Respiratory: Positive for cough and shortness of breath. Negative for wheezing.   Cardiovascular: Negative for chest pain, palpitations and leg swelling.  Gastrointestinal: Negative for abdominal pain, blood in stool, constipation, diarrhea and nausea.  Endocrine: Negative for polydipsia.  Genitourinary: Negative for dysuria, frequency,  hematuria and urgency.  Musculoskeletal: Negative for back pain, myalgias and neck pain.  Skin: Negative for rash.  Allergic/Immunologic: Negative for environmental allergies.  Neurological: Negative for dizziness and headaches.  Hematological: Does not bruise/bleed easily.  Psychiatric/Behavioral: Negative for suicidal ideas. The patient is not nervous/anxious.     Patient Active Problem List   Diagnosis Date Noted  . Benign prostatic hyperplasia without lower urinary tract symptoms 06/21/2017  . Special screening for malignant neoplasms, colon   . Benign neoplasm of transverse colon   . Benign neoplasm of ascending colon   . Obesity, Class I, BMI 30-34.9 07/12/2015  . Overweight 04/12/2015  . Left carotid stenosis 04/12/2015  . Tinea corporis 10/03/2014  . Type 2 diabetes mellitus with diabetic neuropathy, without long-term current use of insulin ([place]) 04/26/2014  . HLD (hyperlipidemia) 04/26/2014  . HTN (hypertension) 04/26/2014  . H/O splenectomy 04/26/2014  . Anterior circulation transient ischemic attack 04/26/2014  . WPW (Wolff-Parkinson-White syndrome) 07/11/2012    No Known Allergies  Past Surgical History:  Procedure Laterality Date  . CARDIAC CATHETERIZATION     over 20 yrs ago.  no issues per pt.  . COLONOSCOPY WITH PROPOFOL N/A 02/12/2017   Procedure: COLONOSCOPY WITH PROPOFOL;  Surgeon: Lucilla Lame, MD;  Location: [facility];  Service: Endoscopy;  Laterality: N/A;  Diabetic - oral meds  . FOOT SURGERY    . HERNIA REPAIR     x2  . POLYPECTOMY  02/12/2017   Procedure: POLYPECTOMY;  Surgeon: Lucilla Lame, MD;  Location: [facility];  Service: Endoscopy;;  . SPLENECTOMY      Social History   Tobacco Use  . Smoking status: Former Smoker    Types: Cigarettes    Quit date: 1997    Years since quitting: 24.2  . Smokeless tobacco: Never Used  Substance Use Topics  . Alcohol use: No    Alcohol/week: 0.0 standard drinks  . Drug use: No      Medication list has been reviewed and updated.  Current Meds  Medication Sig  . amLODipine (NORVASC) 5 MG tablet Take 1 tablet (5 mg total) by mouth daily.  Marland Kitchen aspirin 81 MG tablet Take 81 mg by mouth daily.  Marland Kitchen atorvastatin (LIPITOR) 40 MG tablet Take 1 tablet (40 mg total) by mouth daily.  Marland Kitchen azithromycin (ZITHROMAX) 250 MG tablet Take 1 tablet (250 mg total) by mouth daily. Take first 2 tablets together, then 1 every day until finished.  Marland Kitchen BAYER CONTOUR TEST test strip USE ONE STRIP TO CHECK GLUCOSE ONCE DAILY  . benzonatate (TESSALON) 100 MG capsule Take 1 capsule (100 mg total) by mouth every 8 (eight) hours.  . blood glucose meter kit and supplies Dispense based on patient and insurance preference. Use up to four times daily as directed. (FOR ICD-9 250.00, 250.01).  Marland Kitchen glipiZIDE (GLUCOTROL XL) 10 MG 24 hr tablet Take 1 tablet (10 mg total) by mouth daily with breakfast.  . lisinopril (ZESTRIL) 20 MG tablet Take 1 tablet (20 mg total) by mouth daily.  . metFORMIN (GLUCOPHAGE) 1000 MG tablet Take 1 tablet (1,000 mg total) by mouth 2 (two) times daily.  Marland Kitchen MICROLET LANCETS MISC USE ONE  TO CHECK GLUCOSE ONCE DAILY  . pioglitazone (ACTOS) 15 MG tablet Take 1 tablet (15 mg total) by mouth daily.  . tamsulosin (FLOMAX) 0.4 MG CAPS capsule One a day    PHQ 2/9 Scores 04/06/2019 04/04/2019 08/29/2018 06/10/2018  PHQ - 2 Score 0 0 0 0  PHQ- 9 Score 9 0 - 0    BP Readings from Last 3 Encounters:  04/06/19 102/62  03/28/19 130/82  02/13/19 130/80    Physical Exam Vitals reviewed.  HENT:     Head: Normocephalic.     Right Ear: Tympanic membrane, ear canal and external ear normal.     Left Ear: Tympanic membrane, ear canal and external ear normal.     Nose: Nose normal.     Mouth/Throat:     Mouth: Mucous membranes are moist.  Eyes:     General: No scleral icterus.       Right eye: No discharge.        Left eye: No discharge.     Conjunctiva/sclera: Conjunctivae normal.      Pupils: Pupils are equal, round, and reactive to light.  Neck:     Thyroid: No thyromegaly.     Vascular: No JVD.     Trachea: No tracheal deviation.  Cardiovascular:     Rate and Rhythm: Normal rate and regular rhythm.     Heart sounds: Normal heart sounds. No murmur. No friction rub. No gallop.   Pulmonary:     Effort: No respiratory distress.     Breath sounds: Normal breath sounds. No wheezing or rales.  Abdominal:     General: Bowel sounds are normal.     Palpations: Abdomen is soft. There is no mass.     Tenderness: There is no abdominal  tenderness. There is no guarding or rebound.  Musculoskeletal:        General: No tenderness. Normal range of motion.     Cervical back: Normal range of motion and neck supple.  Lymphadenopathy:     Cervical: No cervical adenopathy.  Skin:    General: Skin is warm.     Coloration: Skin is not jaundiced or pale.     Findings: No bruising, erythema or rash.  Neurological:     Mental Status: He is alert and oriented to person, place, and time.     Cranial Nerves: No cranial nerve deficit.     Deep Tendon Reflexes: Reflexes are normal and symmetric.     Wt Readings from Last 3 Encounters:  04/06/19 178 lb (80.7 kg)  02/13/19 184 lb (83.5 kg)  02/11/19 185 lb (83.9 kg)    BP 102/62   Pulse (!) 120   Temp 98.8 F (37.1 C) (Oral)   Ht _0  (1.702 m)   Wt 178 lb (80.7 kg)   SpO2 97%   BMI 27.88 kg/m   Assessment and Plan: 1. Lower respiratory tract infection due to COVID-19 virus Acute.  Convalescing.  Stable.  Patient continues to have a nonproductive cough which is mild relative to previous interval as well as no fever or chills.  Patient had an apparent exposure to COVID-19 on March 4 and became ill 48 to 72 hours later was noted to be positive for Covid on the 10th.  Patient has been convalescing until he was continued coughing and he was evaluated in an ER/urgent care setting.  Chest x-ray was normal but CT of the chest noted to  have changes consistent with Covid pneumonia.  Patient was placed on azithromycin of which he has completed taking the medication and is beyond the course of therapy.  He is doing better but continues to have a mild cough and pulse ox notes to be down to 96/95% with walking exercise.  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  Patient will continue to convalesce and isolate until next week and we will recheck on Thursday and if patient continues to improve he may return to employment the following week thereafter.  Information given to patient for HR/personal use.  2. Pneumonia of both lungs due to infectious organism, unspecified part of lung On evaluation at urgent care patient was noted to have a pneumonia and for coverage of possible community-acquired pneumonia and the presence of COVID-19 patient was initiated on azithromycin has completed the course with improvement of cough and fever.  There will be no need to continue antibiotic treatment at this time.

## 2019-04-06 NOTE — Patient Instructions (Signed)
COVID-19: Quarantine vs. Isolation QUARANTINE keeps someone who was in close contact with someone who has COVID-19 away from others. If you had close contact with a person who has COVID-19  Stay home until 14 days after your last contact.  Check your temperature twice a day and watch for symptoms of COVID-19.  If possible, stay away from people who are at higher-risk for getting very sick from COVID-19. ISOLATION keeps someone who is sick or tested positive for COVID-19 without symptoms away from others, even in their own home. If you are sick and think or know you have COVID-19  Stay home until after ? At least 10 days since symptoms first appeared and ? At least 24 hours with no fever without fever-reducing medication and ? Symptoms have improved If you tested positive for COVID-19 but do not have symptoms  Stay home until after ? 10 days have passed since your positive test If you live with others, stay in a specific "sick room" or area and away from other people or animals, including pets. Use a separate bathroom, if available. [email or web address] 08/01/2018 This information is not intended to replace advice given to you by your health care provider. Make sure you discuss any questions you have with your health care provider. Document Revised: 12/15/2018 Document Reviewed: 12/15/2018 Elsevier Patient Education  [place] Under Monitoring Name: Melvin MCKOWN Sr.  Location: [address] [place] [address]   CORONAVIRUS DISEASE 2019 (COVID-19) Guidance for Persons Under Investigation You are being tested for the virus that causes coronavirus disease 2019 (COVID-19). Public health actions are necessary to ensure protection of your health and the health of others, and to prevent further spread of infection. COVID-19 is caused by a virus that can cause symptoms, such as fever, cough, and shortness of breath. The primary transmission from person to person  is by coughing or sneezing. On February 10, 2018, the [place] announced a [employer] Emergency of International Concern and on February 11, 2018 the U.S. Department of Health and Human Services declared a public health emergency. If the virus that causesCOVID-19 spreads in the community, it could have severe public health consequences.  As a person under investigation for COVID-19, the [place] advises you to adhere to the following guidance until your test results are reported to you. If your test result is positive, you will receive additional information from your provider and your local health department at that time.   Remain at home until you are cleared by your health provider or public health authorities.   Keep a log of visitors to your home using the form provided. Any visitors to your home must be aware of your isolation status.  If you plan to move to a new address or leave the county, notify the local health department in your county.  Call a doctor or seek care if you have an urgent medical need. Before seeking medical care, call ahead and get instructions from the provider before arriving at the medical office, clinic or hospital. Notify them that you are being tested for the virus that causes COVID-19 so arrangements can be made, as necessary, to prevent transmission to others in the healthcare setting. Next, notify the local health department in your county.  If a medical emergency arises and you need to call 911, inform the first responders that you are being tested for the virus  that causes COVID-19. Next, notify the local health department in your county.  Adhere to all guidance set forth by the [facility] for [facility] of patients that is based on guidance from the Center for Disease Control and Prevention with suspected or confirmed COVID-19. It is provided  with this guidance for Persons Under Investigation.  Your health and the health of our community are our top priorities. Public Health officials remain available to provide assistance and counseling to you about COVID-19 and compliance with this guidance.  Provider: ____________________________________________________________ Date: ______/_____/_________  By signing below, you acknowledge that you have read and agree to comply with this Guidance for Persons Under Investigation. ______________________________________________________________ Date: ______/_____/_________  WHO DO I CALL? You can find a list of local health departments here: [email or web address] Health Department: ____________________________________________________________________ Contact Name: ________________________________________________________________________ Telephone: ___________________________________________________________________________  [name], [place], Communicable Disease Branch COVID-19 Guidance for Persons Under Investigation March 19, 2018

## 2019-04-10 DIAGNOSIS — E785 Hyperlipidemia, unspecified: Secondary | ICD-10-CM | POA: Diagnosis not present

## 2019-04-10 DIAGNOSIS — E1165 Type 2 diabetes mellitus with hyperglycemia: Secondary | ICD-10-CM | POA: Diagnosis not present

## 2019-04-10 DIAGNOSIS — E1169 Type 2 diabetes mellitus with other specified complication: Secondary | ICD-10-CM | POA: Diagnosis not present

## 2019-04-10 DIAGNOSIS — E1159 Type 2 diabetes mellitus with other circulatory complications: Secondary | ICD-10-CM | POA: Diagnosis not present

## 2019-04-13 ENCOUNTER — Ambulatory Visit (INDEPENDENT_AMBULATORY_CARE_PROVIDER_SITE_OTHER): Payer: BC Managed Care – PPO | Admitting: Family Medicine

## 2019-04-13 ENCOUNTER — Encounter: Payer: Self-pay | Admitting: Family Medicine

## 2019-04-13 ENCOUNTER — Other Ambulatory Visit: Payer: Self-pay

## 2019-04-13 VITALS — BP 124/68 | HR 68 | Temp 98.7°F | Ht 67.0 in | Wt 180.0 lb

## 2019-04-13 DIAGNOSIS — J22 Unspecified acute lower respiratory infection: Secondary | ICD-10-CM | POA: Diagnosis not present

## 2019-04-13 DIAGNOSIS — U071 COVID-19: Secondary | ICD-10-CM

## 2019-04-13 NOTE — Progress Notes (Signed)
Date:  04/13/2019   Name:  Melvin STIEG Sr.   DOB:  [birth year]   MRN:  [id number]   Chief Complaint: Follow-up (covid/pneumonia)  Patient is a 59  year old male who presents for a post covid exam. The patient reports the following problems: stable s/p covid infection. Health maintenance has been reviewed up to date.   Lab Results  Component Value Date   CREATININE 1.11 02/13/2019   BUN 12 02/13/2019   NA 138 02/13/2019   K 5.1 02/13/2019   CL 100 02/13/2019   CO2 24 02/13/2019   Lab Results  Component Value Date   CHOL 153 02/13/2019   HDL 55 02/13/2019   LDLCALC 77 02/13/2019   TRIG 115 02/13/2019   CHOLHDL 4.0 06/10/2018   Lab Results  Component Value Date   TSH 1.180 07/12/2015   Lab Results  Component Value Date   HGBA1C 8.5 (H) 02/13/2019   Lab Results  Component Value Date   WBC 15.1 (H) 08/27/2015   HGB 15.6 08/27/2015   HCT 46.2 08/27/2015   MCV 91.9 08/27/2015   PLT 484 (H) 08/27/2015   Lab Results  Component Value Date   ALT 22 02/13/2019   AST 14 02/13/2019   ALKPHOS 73 02/13/2019   BILITOT 0.4 02/13/2019     Review of Systems  Constitutional: Negative for chills and fever.  HENT: Negative for drooling, ear discharge, ear pain and sore throat.   Respiratory: Negative for cough, shortness of breath and wheezing.   Cardiovascular: Negative for chest pain, palpitations and leg swelling.  Gastrointestinal: Negative for abdominal pain, blood in stool, constipation, diarrhea and nausea.  Endocrine: Negative for polydipsia.  Genitourinary: Negative for dysuria, frequency, hematuria and urgency.  Musculoskeletal: Negative for back pain, myalgias and neck pain.  Skin: Negative for rash.  Allergic/Immunologic: Negative for environmental allergies.  Neurological: Negative for dizziness and headaches.  Hematological: Does not bruise/bleed easily.  Psychiatric/Behavioral: Negative for suicidal ideas. The patient is not nervous/anxious.      Patient Active Problem List   Diagnosis Date Noted  . Benign prostatic hyperplasia without lower urinary tract symptoms 06/21/2017  . Special screening for malignant neoplasms, colon   . Benign neoplasm of transverse colon   . Benign neoplasm of ascending colon   . Obesity, Class I, BMI 30-34.9 07/12/2015  . Overweight 04/12/2015  . Left carotid stenosis 04/12/2015  . Tinea corporis 10/03/2014  . Type 2 diabetes mellitus with diabetic neuropathy, without long-term current use of insulin ([place]) 04/26/2014  . HLD (hyperlipidemia) 04/26/2014  . HTN (hypertension) 04/26/2014  . H/O splenectomy 04/26/2014  . Anterior circulation transient ischemic attack 04/26/2014  . WPW (Wolff-Parkinson-White syndrome) 07/11/2012    No Known Allergies  Past Surgical History:  Procedure Laterality Date  . CARDIAC CATHETERIZATION     over 20 yrs ago.  no issues per pt.  . COLONOSCOPY WITH PROPOFOL N/A 02/12/2017   Procedure: COLONOSCOPY WITH PROPOFOL;  Surgeon: Lucilla Lame, MD;  Location: [place];  Service: Endoscopy;  Laterality: N/A;  Diabetic - oral meds  . FOOT SURGERY    . HERNIA REPAIR     x2  . POLYPECTOMY  02/12/2017   Procedure: POLYPECTOMY;  Surgeon: Lucilla Lame, MD;  Location: [place];  Service: Endoscopy;;  . SPLENECTOMY      Social History   Tobacco Use  . Smoking status: Former Smoker    Types: Cigarettes    Quit date: 1997    Years  since quitting: 24.2  . Smokeless tobacco: Never Used  Substance Use Topics  . Alcohol use: No    Alcohol/week: 0.0 standard drinks  . Drug use: No     Medication list has been reviewed and updated.  Current Meds  Medication Sig  . amLODipine (NORVASC) 5 MG tablet Take 1 tablet (5 mg total) by mouth daily.  Marland Kitchen aspirin 81 MG tablet Take 81 mg by mouth daily.  Marland Kitchen atorvastatin (LIPITOR) 40 MG tablet Take 1 tablet (40 mg total) by mouth daily.  Marland Kitchen BAYER CONTOUR TEST test strip USE ONE STRIP TO CHECK GLUCOSE ONCE DAILY   . blood glucose meter kit and supplies Dispense based on patient and insurance preference. Use up to four times daily as directed. (FOR ICD-9 250.00, 250.01).  . Dapagliflozin-metFORMIN HCl ER 05-998 MG TB24 Take by mouth. endo  . glipiZIDE (GLUCOTROL XL) 10 MG 24 hr tablet Take 1 tablet (10 mg total) by mouth daily with breakfast.  . lisinopril (ZESTRIL) 20 MG tablet Take 1 tablet (20 mg total) by mouth daily.  Marland Kitchen MICROLET LANCETS MISC USE ONE  TO CHECK GLUCOSE ONCE DAILY  . pioglitazone (ACTOS) 15 MG tablet Take 1 tablet (15 mg total) by mouth daily.  . tamsulosin (FLOMAX) 0.4 MG CAPS capsule One a day    PHQ 2/9 Scores 04/06/2019 04/04/2019 08/29/2018 06/10/2018  PHQ - 2 Score 0 0 0 0  PHQ- 9 Score 9 0 - 0    BP Readings from Last 3 Encounters:  04/13/19 124/68  04/06/19 102/62  03/28/19 130/82    Physical Exam Vitals and nursing note reviewed.  HENT:     Head: Normocephalic.     Right Ear: Tympanic membrane, ear canal and external ear normal.     Left Ear: Tympanic membrane, ear canal and external ear normal.     Nose: Nose normal. No congestion or rhinorrhea.  Eyes:     General: No scleral icterus.       Right eye: No discharge.        Left eye: No discharge.     Conjunctiva/sclera: Conjunctivae normal.     Pupils: Pupils are equal, round, and reactive to light.  Neck:     Thyroid: No thyromegaly.     Vascular: No JVD.     Trachea: No tracheal deviation.  Cardiovascular:     Rate and Rhythm: Normal rate and regular rhythm.     Heart sounds: Normal heart sounds. No murmur. No friction rub. No gallop.   Pulmonary:     Effort: No respiratory distress.     Breath sounds: Normal breath sounds. No wheezing, rhonchi or rales.  Abdominal:     General: Bowel sounds are normal.     Palpations: Abdomen is soft. There is no mass.     Tenderness: There is no abdominal tenderness. There is no guarding or rebound.  Musculoskeletal:        General: No tenderness. Normal range of  motion.     Cervical back: Normal range of motion and neck supple.  Lymphadenopathy:     Cervical: No cervical adenopathy.  Skin:    General: Skin is warm.     Capillary Refill: Capillary refill takes less than 2 seconds.     Findings: No rash.  Neurological:     Mental Status: He is alert and oriented to person, place, and time.     Cranial Nerves: No cranial nerve deficit.     Deep Tendon Reflexes: Reflexes are  normal and symmetric.     Wt Readings from Last 3 Encounters:  04/13/19 180 lb (81.6 kg)  04/06/19 178 lb (80.7 kg)  02/13/19 184 lb (83.5 kg)    BP 124/68   Pulse 68   Temp 98.7 F (37.1 C)   Ht _0  (1.702 m)   Wt 180 lb (81.6 kg)   SpO2 98%   BMI 28.19 kg/m   Assessment and Plan: 1. Lower respiratory tract infection due to COVID-19 virus Patient has recovered from bilateral pneumonia due to Covid infection.  Pulse ox is 98% and patient feels significantly better from a week prior.  Patient's been cleared for return to work due to no longer being infectious as well as improvement in his overall condition from post infection.

## 2019-06-09 DIAGNOSIS — Z713 Dietary counseling and surveillance: Secondary | ICD-10-CM | POA: Diagnosis not present

## 2019-06-15 ENCOUNTER — Encounter: Payer: BC Managed Care – PPO | Admitting: Family Medicine

## 2019-07-10 DIAGNOSIS — E1165 Type 2 diabetes mellitus with hyperglycemia: Secondary | ICD-10-CM | POA: Diagnosis not present

## 2019-07-10 DIAGNOSIS — I152 Hypertension secondary to endocrine disorders: Secondary | ICD-10-CM | POA: Diagnosis not present

## 2019-07-10 DIAGNOSIS — E1159 Type 2 diabetes mellitus with other circulatory complications: Secondary | ICD-10-CM | POA: Diagnosis not present

## 2019-07-10 DIAGNOSIS — E559 Vitamin D deficiency, unspecified: Secondary | ICD-10-CM | POA: Diagnosis not present

## 2019-07-10 DIAGNOSIS — E1169 Type 2 diabetes mellitus with other specified complication: Secondary | ICD-10-CM | POA: Diagnosis not present

## 2019-07-10 DIAGNOSIS — E785 Hyperlipidemia, unspecified: Secondary | ICD-10-CM | POA: Diagnosis not present

## 2019-07-11 ENCOUNTER — Ambulatory Visit (INDEPENDENT_AMBULATORY_CARE_PROVIDER_SITE_OTHER): Payer: BC Managed Care – PPO | Admitting: Family Medicine

## 2019-07-11 ENCOUNTER — Other Ambulatory Visit: Payer: Self-pay

## 2019-07-11 ENCOUNTER — Encounter: Payer: Self-pay | Admitting: Family Medicine

## 2019-07-11 VITALS — BP 130/80 | HR 88 | Ht 67.0 in | Wt 176.0 lb

## 2019-07-11 DIAGNOSIS — Z0000 Encounter for general adult medical examination without abnormal findings: Secondary | ICD-10-CM

## 2019-07-11 DIAGNOSIS — N400 Enlarged prostate without lower urinary tract symptoms: Secondary | ICD-10-CM

## 2019-07-11 DIAGNOSIS — E782 Mixed hyperlipidemia: Secondary | ICD-10-CM | POA: Diagnosis not present

## 2019-07-11 DIAGNOSIS — I10 Essential (primary) hypertension: Secondary | ICD-10-CM | POA: Diagnosis not present

## 2019-07-11 MED ORDER — FINASTERIDE 5 MG PO TABS: 5.0000 mg | ORAL_TABLET | Freq: Every day | ORAL | 1 refills | Status: DC

## 2019-07-11 MED ORDER — LISINOPRIL 20 MG PO TABS: 20.0000 mg | ORAL_TABLET | Freq: Every day | ORAL | 1 refills | Status: DC

## 2019-07-11 MED ORDER — ATORVASTATIN CALCIUM 40 MG PO TABS: 40.0000 mg | ORAL_TABLET | Freq: Every day | ORAL | 1 refills | Status: DC

## 2019-07-11 MED ORDER — AMLODIPINE BESYLATE 5 MG PO TABS: 5.0000 mg | ORAL_TABLET | Freq: Every day | ORAL | 1 refills | Status: DC

## 2019-07-11 NOTE — Progress Notes (Signed)
Date:  07/11/2019   Name:  Melvin ELLERBY Sr.   DOB:  1960/08/23   MRN:  [id number]   Chief Complaint: Annual Exam  Patient is a 59 year old male who presents for a comprehensive physical exam. The patient reports the following problems: hypertension/hyperlipidemia. Health maintenance has been reviewed up to date.   Lab Results  Component Value Date   CREATININE 1.11 02/13/2019   BUN 12 02/13/2019   NA 138 02/13/2019   K 5.1 02/13/2019   CL 100 02/13/2019   CO2 24 02/13/2019   Lab Results  Component Value Date   CHOL 153 02/13/2019   HDL 55 02/13/2019   LDLCALC 77 02/13/2019   TRIG 115 02/13/2019   CHOLHDL 4.0 06/10/2018   Lab Results  Component Value Date   TSH 1.180 07/12/2015   Lab Results  Component Value Date   HGBA1C 8.5 (H) 02/13/2019   Lab Results  Component Value Date   WBC 15.1 (H) 08/27/2015   HGB 15.6 08/27/2015   HCT 46.2 08/27/2015   MCV 91.9 08/27/2015   PLT 484 (H) 08/27/2015   Lab Results  Component Value Date   ALT 22 02/13/2019   AST 14 02/13/2019   ALKPHOS 73 02/13/2019   BILITOT 0.4 02/13/2019     Review of Systems  Constitutional: Negative for chills and fever.  HENT: Negative for drooling, ear discharge, ear pain and sore throat.   Respiratory: Negative for cough, shortness of breath and wheezing.   Cardiovascular: Negative for chest pain, palpitations and leg swelling.  Gastrointestinal: Negative for abdominal pain, blood in stool, constipation, diarrhea and nausea.  Endocrine: Negative for polydipsia.  Genitourinary: Negative for dysuria, frequency, hematuria and urgency.  Musculoskeletal: Negative for back pain, myalgias and neck pain.  Skin: Negative for rash.  Allergic/Immunologic: Negative for environmental allergies.  Neurological: Negative for dizziness and headaches.  Hematological: Does not bruise/bleed easily.  Psychiatric/Behavioral: Negative for suicidal ideas. The patient is not nervous/anxious.     Patient  Active Problem List   Diagnosis Date Noted  . Benign prostatic hyperplasia without lower urinary tract symptoms 06/21/2017  . Special screening for malignant neoplasms, colon   . Benign neoplasm of transverse colon   . Benign neoplasm of ascending colon   . Obesity, Class I, BMI 30-34.9 07/12/2015  . Overweight 04/12/2015  . Left carotid stenosis 04/12/2015  . Tinea corporis 10/03/2014  . Type 2 diabetes mellitus with diabetic neuropathy, without long-term current use of insulin ([place]) 04/26/2014  . HLD (hyperlipidemia) 04/26/2014  . HTN (hypertension) 04/26/2014  . H/O splenectomy 04/26/2014  . Anterior circulation transient ischemic attack 04/26/2014  . WPW (Wolff-Parkinson-White syndrome) 07/11/2012    No Known Allergies  Past Surgical History:  Procedure Laterality Date  . CARDIAC CATHETERIZATION     over 20 yrs ago.  no issues per pt.  . COLONOSCOPY WITH PROPOFOL N/A 02/12/2017   Procedure: COLONOSCOPY WITH PROPOFOL;  Surgeon: Lucilla Lame, MD;  Location: [place];  Service: Endoscopy;  Laterality: N/A;  Diabetic - oral meds  . FOOT SURGERY    . HERNIA REPAIR     x2  . POLYPECTOMY  02/12/2017   Procedure: POLYPECTOMY;  Surgeon: Lucilla Lame, MD;  Location: [place];  Service: Endoscopy;;  . SPLENECTOMY      Social History   Tobacco Use  . Smoking status: Former Smoker    Types: Cigarettes    Quit date: 1997    Years since quitting: 24.5  .  Smokeless tobacco: Never Used  Vaping Use  . Vaping Use: Never used  Substance Use Topics  . Alcohol use: No    Alcohol/week: 0.0 standard drinks  . Drug use: No     Medication list has been reviewed and updated.  Current Meds  Medication Sig  . amLODipine (NORVASC) 5 MG tablet Take 1 tablet (5 mg total) by mouth daily.  Marland Kitchen aspirin 81 MG tablet Take 81 mg by mouth daily.  Marland Kitchen atorvastatin (LIPITOR) 40 MG tablet Take 1 tablet (40 mg total) by mouth daily.  Marland Kitchen BAYER CONTOUR TEST test strip USE ONE STRIP TO  CHECK GLUCOSE ONCE DAILY  . blood glucose meter kit and supplies Dispense based on patient and insurance preference. Use up to four times daily as directed. (FOR ICD-9 250.00, 250.01).  . Dapagliflozin-metFORMIN HCl ER 05-998 MG TB24 Take by mouth. endo  . glipiZIDE (GLUCOTROL XL) 10 MG 24 hr tablet Take 1 tablet (10 mg total) by mouth daily with breakfast.  . lisinopril (ZESTRIL) 20 MG tablet Take 1 tablet (20 mg total) by mouth daily.  Marland Kitchen MICROLET LANCETS MISC USE ONE  TO CHECK GLUCOSE ONCE DAILY  . pioglitazone (ACTOS) 15 MG tablet Take 1 tablet (15 mg total) by mouth daily.  . tamsulosin (FLOMAX) 0.4 MG CAPS capsule One a day    PHQ 2/9 Scores 07/11/2019 04/06/2019 04/04/2019 08/29/2018  PHQ - 2 Score 0 0 0 0  PHQ- 9 Score 0 9 0 -    GAD 7 : Generalized Anxiety Score 07/11/2019 04/06/2019 04/04/2019  Nervous, Anxious, on Edge 0 0 0  Control/stop worrying 0 0 0  Worry too much - different things 0 0 0  Trouble relaxing 0 0 0  Restless 0 0 0  Easily annoyed or irritable 0 0 0  Afraid - awful might happen 0 0 0  Total GAD 7 Score 0 0 0  Anxiety Difficulty - Not difficult at all -    BP Readings from Last 3 Encounters:  07/11/19 130/80  04/13/19 124/68  04/06/19 102/62    Physical Exam Vitals and nursing note reviewed.  Constitutional:      Appearance: Normal appearance. He is well-developed, well-groomed and overweight.  HENT:     Head: Normocephalic.     Jaw: There is normal jaw occlusion.     Right Ear: Hearing, tympanic membrane, ear canal and external ear normal.     Left Ear: Hearing, tympanic membrane, ear canal and external ear normal.     Nose: Nose normal. No congestion or rhinorrhea.     Mouth/Throat:     Lips: Pink.     Mouth: Mucous membranes are moist.     Palate: No mass.     Pharynx: Oropharynx is clear. Uvula midline. No pharyngeal swelling, oropharyngeal exudate, posterior oropharyngeal erythema or uvula swelling.  Eyes:     General: Lids are normal. Vision  grossly intact. Gaze aligned appropriately. No scleral icterus.       Right eye: No discharge.        Left eye: No discharge.     Conjunctiva/sclera: Conjunctivae normal.     Pupils: Pupils are equal, round, and reactive to light.     Funduscopic exam:    Right eye: Red reflex present.        Left eye: Red reflex present. Neck:     Thyroid: No thyroid mass, thyromegaly or thyroid tenderness.     Vascular: Normal carotid pulses. No carotid bruit, hepatojugular reflux or  JVD.     Trachea: Trachea normal. No tracheal deviation.  Cardiovascular:     Rate and Rhythm: Normal rate and regular rhythm.     Pulses:          Carotid pulses are 2+ on the right side and 2+ on the left side.      Radial pulses are 2+ on the right side and 2+ on the left side.       Femoral pulses are 2+ on the right side and 2+ on the left side.      Popliteal pulses are 2+ on the right side and 2+ on the left side.       Dorsalis pedis pulses are 2+ on the right side and 2+ on the left side.       Posterior tibial pulses are 2+ on the right side and 2+ on the left side.     Heart sounds: Normal heart sounds, S1 normal and S2 normal. No murmur heard.  No systolic murmur is present.  No diastolic murmur is present.  No friction rub. No gallop. No S3 or S4 sounds.   Pulmonary:     Effort: Pulmonary effort is normal. No respiratory distress.     Breath sounds: Normal breath sounds. No decreased breath sounds, wheezing, rhonchi or rales.  Chest:     Breasts:        Right: Normal.        Left: Normal.  Abdominal:     General: Bowel sounds are normal.     Palpations: Abdomen is soft. There is no hepatomegaly, splenomegaly or mass.     Tenderness: There is no abdominal tenderness. There is no guarding or rebound.  Genitourinary:    Penis: Normal and circumcised.      Testes: Normal.        Right: Mass not present.        Left: Mass not present.     Epididymis:     Right: Normal.     Left: Normal.     Prostate:  Normal. Not enlarged, not tender and no nodules present.     Rectum: Normal. Guaiac result negative. No mass.  Musculoskeletal:        General: No tenderness. Normal range of motion.     Cervical back: Full passive range of motion without pain, normal range of motion and neck supple.     Right lower leg: No edema.     Left lower leg: No edema.  Lymphadenopathy:     Head:     Right side of head: No submental, submandibular or tonsillar adenopathy.     Left side of head: No submental, submandibular or tonsillar adenopathy.     Cervical: No cervical adenopathy.     Right cervical: No superficial cervical adenopathy.    Left cervical: No superficial cervical adenopathy.  Skin:    General: Skin is warm.     Capillary Refill: Capillary refill takes less than 2 seconds.     Findings: No rash.  Neurological:     Mental Status: He is alert and oriented to person, place, and time.     Cranial Nerves: Cranial nerves are intact. No cranial nerve deficit.     Sensory: Sensation is intact.     Motor: Motor function is intact.     Deep Tendon Reflexes: Reflexes are normal and symmetric.  Psychiatric:        Attention and Perception: Attention normal.  Mood and Affect: Mood and affect normal.        Speech: Speech normal.        Cognition and Memory: Cognition normal.     Wt Readings from Last 3 Encounters:  07/11/19 176 lb (79.8 kg)  04/13/19 180 lb (81.6 kg)  04/06/19 178 lb (80.7 kg)    BP 130/80   Pulse 88   Ht _0  (1.702 m)   Wt 176 lb (79.8 kg)   BMI 27.57 kg/m   Assessment and Plan: 1. Annual physical exam Subjective objective concerns noted on history and physical exam.  Patient's chart was reviewed and previous encounters, most recent labs, most recent imaging, and care everywhere were reviewed.Noah Delaine Sr. is a 59 y.o. male who presents today for his Complete Annual Exam. He feels well. He reports exercising . He reports he is sleeping well.  Patient had  relatively recent labs drawn and these were reviewed and were in acceptable range.Immunizations are reviewed and recommendations provided.   Age appropriate screening tests are discussed. Counseling given for risk factor reduction interventions.  2. Essential hypertension Chronic.  Controlled.  Stable.  Continue amlodipine 5 mg once a day and lisinopril 20 mg once a day. - amLODipine (NORVASC) 5 MG tablet; Take 1 tablet (5 mg total) by mouth daily.  Dispense: 90 tablet; Refill: 1 - lisinopril (ZESTRIL) 20 MG tablet; Take 1 tablet (20 mg total) by mouth daily.  Dispense: 90 tablet; Refill: 1  3. Mixed hyperlipidemia Plan.  Controlled.  Stable.  Continue atorvastatin 40 mg once a day. - atorvastatin (LIPITOR) 40 MG tablet; Take 1 tablet (40 mg total) by mouth daily.  Dispense: 90 tablet; Refill: 1  4. Benign prostatic hyperplasia without lower urinary tract symptoms Chronic.  Controlled.  Stable.  DRE was then normal range.  We will continue finasteride 5 mg 1 a day. - finasteride (PROSCAR) 5 MG tablet; Take 1 tablet (5 mg total) by mouth daily.  Dispense: 90 tablet; Refill: 1

## 2019-07-13 ENCOUNTER — Telehealth: Payer: Self-pay

## 2019-07-13 ENCOUNTER — Telehealth: Payer: Self-pay | Admitting: Family Medicine

## 2019-07-13 NOTE — Telephone Encounter (Signed)
Called pt and left a message with the result of the pharmacy reps checking on Xigduo for pt- he has a 2700.00 deductible on meds with his ins. I told him to give Honor Junes at call, along with left both [place] telephone numbers to endo.

## 2019-07-13 NOTE — Telephone Encounter (Signed)
Copied from [place] [id number]. Topic: General - Other >> Jul 13, 2019  2:01 PM Alanda Slim E wrote: Reason for CRM: Ask for Baxter Flattery to call him back about a medication and some samples they discussed / please advise

## 2019-07-13 NOTE — Telephone Encounter (Signed)
Tried to call pt- no answer. Repeated message

## 2019-07-14 NOTE — Telephone Encounter (Signed)
Spoke to pt

## 2019-07-14 NOTE — Telephone Encounter (Signed)
Pt is returning tara call

## 2019-09-05 ENCOUNTER — Telehealth: Payer: Self-pay | Admitting: Family Medicine

## 2019-09-05 ENCOUNTER — Other Ambulatory Visit: Payer: Self-pay

## 2019-09-05 DIAGNOSIS — E1140 Type 2 diabetes mellitus with diabetic neuropathy, unspecified: Secondary | ICD-10-CM

## 2019-09-05 MED ORDER — METFORMIN HCL 500 MG PO TABS: 500.0000 mg | ORAL_TABLET | Freq: Every day | ORAL | 0 refills | Status: DC

## 2019-09-05 MED ORDER — PIOGLITAZONE HCL 30 MG PO TABS: 30.0000 mg | ORAL_TABLET | Freq: Every day | ORAL | 0 refills | Status: DC

## 2019-09-05 NOTE — Telephone Encounter (Signed)
Spoke to pt on phone- will see in 6 weeks- start back on metformin, stop combo pill, stay same on glipizide and increase on Actos to 30mg 

## 2019-09-05 NOTE — Telephone Encounter (Signed)
Copied from [place] [id number]. Topic: General - Other >> Sep 05, 2019 10:06 AM Sheran Luz wrote: Patient requesting to speak with Baxter Flattery. Patient would not disclose additional information.

## 2019-09-06 NOTE — Telephone Encounter (Signed)
Pt called to speak with Baxter Flattery to go over some other questions about his medications/ please advise

## 2019-09-07 NOTE — Telephone Encounter (Signed)
Pt is going to have wife pick up Xigduo samples and we have someone that helps with diabetics and their meds trying to see if she can get him some assistance with this med. If not, we will go back to original plan.

## 2019-09-12 ENCOUNTER — Telehealth: Payer: Self-pay | Admitting: Pharmacist

## 2019-09-12 ENCOUNTER — Telehealth: Payer: Self-pay | Admitting: Family Medicine

## 2019-09-12 NOTE — Progress Notes (Signed)
  Chronic Care Management   Note  09/12/2019 Name: MEIKO STRANAHAN Sr. MRN: [id number] DOB: [birth year]    I spoke briefly  with Mr. Geibel today regarding alternatives to [facility] therapy as this is cost-prohibitive for him.  We discussed possibly changing to ertugliflozin [facility]) and metformin. [employer] offers a copay savings card which will cover up to $583/month out of pocket expense for privately insured patients. Mr. Deguire is interested in learning more about this option and will call me later this week as he was pulled away for his job and had to end our conversation. Patient's wife recently picked up additional samples giving him adequate supply at this time.    Junita Push. Kenton Kingfisher PharmD, [facility] [phone]

## 2019-09-12 NOTE — Telephone Encounter (Signed)
Copied from [place] [id number]. Topic: General - Other >> Sep 12, 2019  8:45 AM Keene Breath wrote: Reason for CRM: Patient called to inform Baxter Flattery that his wife will pick up the medication that she is holding for him today in the afternoon.  If there are any questions, just give him a call at [phone]

## 2019-09-19 ENCOUNTER — Other Ambulatory Visit: Payer: Self-pay | Admitting: Family Medicine

## 2019-09-19 DIAGNOSIS — E1140 Type 2 diabetes mellitus with diabetic neuropathy, unspecified: Secondary | ICD-10-CM

## 2019-10-06 ENCOUNTER — Telehealth: Payer: Self-pay | Admitting: Pharmacist

## 2019-10-06 NOTE — Progress Notes (Signed)
10/06/19-Called and lvm to have him kindly return call for a follow up and speak briefly with him regarding the possibility of switching him to [employer].     Raynelle Highland, [place] Assistant [phone]

## 2019-10-10 NOTE — Progress Notes (Signed)
10/06/19-lvm to have patient return call to discuss information regarding possibly changing to [place] medication per Birdena Crandall, [facility].

## 2019-10-16 ENCOUNTER — Other Ambulatory Visit: Payer: Self-pay | Admitting: Family Medicine

## 2019-10-16 ENCOUNTER — Telehealth: Payer: Self-pay | Admitting: Pharmacist

## 2019-10-16 DIAGNOSIS — E1140 Type 2 diabetes mellitus with diabetic neuropathy, unspecified: Secondary | ICD-10-CM

## 2019-10-16 DIAGNOSIS — N400 Enlarged prostate without lower urinary tract symptoms: Secondary | ICD-10-CM

## 2019-10-16 NOTE — Telephone Encounter (Signed)
Pioglitazone approved per protocol. Tamsulosin  Is not currently on medication profile-see 07/11/19 encounter note where Tamsulosin was discontinued for reorder. Routing Tamsulosin to provider for clarification.

## 2019-10-16 NOTE — Telephone Encounter (Signed)
See 07/11/19 Encounter note. Tamsulosin was discontinued for reorder per note. Please clarify.

## 2019-10-16 NOTE — Progress Notes (Addendum)
    Chronic Care Management Pharmacy Assistant   Name: Melvin Torres Sr.  MRN: [id number] DOB: 17-Aug-1960  Reason for Encounter: Medication Management/Follow up  PCP : Juline Patch, MD  Allergies:  No Known Allergies  Medications: Outpatient Encounter Medications as of 10/16/2019  Medication Sig   amLODipine (NORVASC) 5 MG tablet Take 1 tablet (5 mg total) by mouth daily.   aspirin 81 MG tablet Take 81 mg by mouth daily.   atorvastatin (LIPITOR) 40 MG tablet Take 1 tablet (40 mg total) by mouth daily.   BAYER CONTOUR TEST test strip USE ONE STRIP TO CHECK GLUCOSE ONCE DAILY   blood glucose meter kit and supplies Dispense based on patient and insurance preference. Use up to four times daily as directed. (FOR ICD-9 250.00, 250.01).   finasteride (PROSCAR) 5 MG tablet Take 1 tablet (5 mg total) by mouth daily.   glipiZIDE (GLUCOTROL XL) 10 MG 24 hr tablet Take 1 tablet (10 mg total) by mouth daily with breakfast.   lisinopril (ZESTRIL) 20 MG tablet Take 1 tablet (20 mg total) by mouth daily.   metFORMIN (GLUCOPHAGE) 500 MG tablet Take 1 tablet (500 mg total) by mouth daily with breakfast.   MICROLET LANCETS MISC USE ONE  TO CHECK GLUCOSE ONCE DAILY   pioglitazone (ACTOS) 30 MG tablet Take 1 tablet (30 mg total) by mouth daily.   No facility-administered encounter medications on file as of 10/16/2019.    Current Diagnosis: Patient Active Problem List   Diagnosis Date Noted   Benign prostatic hyperplasia without lower urinary tract symptoms 06/21/2017   Special screening for malignant neoplasms, colon    Benign neoplasm of transverse colon    Benign neoplasm of ascending colon    Obesity, Class I, BMI 30-34.9 07/12/2015   Overweight 04/12/2015   Left carotid stenosis 04/12/2015   Tinea corporis 10/03/2014   Type 2 diabetes mellitus with diabetic neuropathy, without long-term current use of insulin ([place]) 04/26/2014   HLD (hyperlipidemia) 04/26/2014   HTN (hypertension)  04/26/2014   H/O splenectomy 04/26/2014   Anterior circulation transient ischemic attack 04/26/2014   WPW (Wolff-Parkinson-White syndrome) 07/11/2012   11/10/19-CPA attempted to reach out to the patient and follow up regarding appointment with Dr.Jones on 10/23/19. No answer; left a HIPAA compliant voicemail for a call back.  10/16/19-Per patient he wants to D/C Xigduo due to cost and he prefers the other medications because he "knows" them.  He does have a scheduled follow up with Dr. Ronnald Ramp on 10-23-19. He did confirm and verbalize appointment date and time. He also verbalized the above medications regimen.     Follow-Up:  Pharmacist Review

## 2019-10-17 ENCOUNTER — Ambulatory Visit: Payer: Self-pay | Admitting: Family Medicine

## 2019-10-18 ENCOUNTER — Other Ambulatory Visit: Payer: Self-pay | Admitting: Family Medicine

## 2019-10-18 ENCOUNTER — Telehealth: Payer: Self-pay

## 2019-10-18 DIAGNOSIS — N400 Enlarged prostate without lower urinary tract symptoms: Secondary | ICD-10-CM

## 2019-10-18 NOTE — Telephone Encounter (Signed)
Rx sent 10/16/19- not confirmed received by pharmacy- Rx resent.

## 2019-10-18 NOTE — Telephone Encounter (Signed)
Copied from [place] [id number]. Topic: General - Other >> Oct 18, 2019 10:26 AM Keene Breath wrote: Reason for CRM: Patient stated he is returning a call to [facility] regarding some medication changes.  Please call patient back at [phone]

## 2019-10-19 ENCOUNTER — Other Ambulatory Visit: Payer: Self-pay

## 2019-10-19 DIAGNOSIS — E1140 Type 2 diabetes mellitus with diabetic neuropathy, unspecified: Secondary | ICD-10-CM

## 2019-10-19 MED ORDER — PIOGLITAZONE HCL 30 MG PO TABS: 30.0000 mg | ORAL_TABLET | Freq: Every day | ORAL | 0 refills | Status: DC

## 2019-10-19 NOTE — Telephone Encounter (Signed)
Called and left message for pt to call back.

## 2019-10-23 ENCOUNTER — Other Ambulatory Visit: Payer: Self-pay

## 2019-10-23 ENCOUNTER — Ambulatory Visit (INDEPENDENT_AMBULATORY_CARE_PROVIDER_SITE_OTHER): Payer: BC Managed Care – PPO | Admitting: Family Medicine

## 2019-10-23 ENCOUNTER — Encounter: Payer: Self-pay | Admitting: Family Medicine

## 2019-10-23 VITALS — BP 130/80 | HR 64 | Ht 67.0 in | Wt 184.0 lb

## 2019-10-23 DIAGNOSIS — Z23 Encounter for immunization: Secondary | ICD-10-CM

## 2019-10-23 DIAGNOSIS — I10 Essential (primary) hypertension: Secondary | ICD-10-CM | POA: Diagnosis not present

## 2019-10-23 DIAGNOSIS — E1140 Type 2 diabetes mellitus with diabetic neuropathy, unspecified: Secondary | ICD-10-CM | POA: Diagnosis not present

## 2019-10-23 DIAGNOSIS — N400 Enlarged prostate without lower urinary tract symptoms: Secondary | ICD-10-CM | POA: Diagnosis not present

## 2019-10-23 DIAGNOSIS — E782 Mixed hyperlipidemia: Secondary | ICD-10-CM

## 2019-10-23 MED ORDER — GLIPIZIDE ER 10 MG PO TB24: 10.0000 mg | ORAL_TABLET | Freq: Every day | ORAL | 1 refills | Status: DC

## 2019-10-23 MED ORDER — PIOGLITAZONE HCL 30 MG PO TABS: 30.0000 mg | ORAL_TABLET | Freq: Every day | ORAL | 1 refills | Status: DC

## 2019-10-23 MED ORDER — ATORVASTATIN CALCIUM 40 MG PO TABS: 40.0000 mg | ORAL_TABLET | Freq: Every day | ORAL | 1 refills | Status: DC

## 2019-10-23 MED ORDER — FINASTERIDE 5 MG PO TABS: 5.0000 mg | ORAL_TABLET | Freq: Every day | ORAL | 1 refills | Status: DC

## 2019-10-23 MED ORDER — LISINOPRIL 20 MG PO TABS: 20.0000 mg | ORAL_TABLET | Freq: Every day | ORAL | 1 refills | Status: DC

## 2019-10-23 MED ORDER — TAMSULOSIN HCL 0.4 MG PO CAPS: 0.4000 mg | ORAL_CAPSULE | Freq: Every day | ORAL | 1 refills | Status: DC

## 2019-10-23 MED ORDER — AMLODIPINE BESYLATE 5 MG PO TABS: 5.0000 mg | ORAL_TABLET | Freq: Every day | ORAL | 1 refills | Status: DC

## 2019-10-23 NOTE — Progress Notes (Signed)
Date:  10/23/2019   Name:  Melvin KASPAR Sr.   DOB:  16-Sep-1960   MRN:  [id number]   Chief Complaint: Diabetes and Flu Vaccine  Diabetes He presents for his follow-up diabetic visit. He has type 2 diabetes mellitus. His disease course has been fluctuating. There are no hypoglycemic associated symptoms. Pertinent negatives for hypoglycemia include no dizziness, headaches or nervousness/anxiousness. There are no diabetic associated symptoms. Pertinent negatives for diabetes include no chest pain and no polydipsia. There are no hypoglycemic complications. Symptoms are improving. There are no diabetic complications. Risk factors for coronary artery disease include diabetes mellitus, hypertension, dyslipidemia and male sex. Current diabetic treatment includes oral agent (dual therapy). He is compliant with treatment all of the time. His weight is fluctuating minimally. He is following a generally healthy diet. Meal planning includes carbohydrate counting and avoidance of concentrated sweets. He has had a previous visit with a dietitian. He participates in exercise daily. His home blood glucose trend is fluctuating minimally. His breakfast blood glucose is taken between 8-9 am. His breakfast blood glucose range is generally 140-180 mg/dl. An ACE inhibitor/angiotensin II receptor blocker is being taken.    Lab Results  Component Value Date   CREATININE 1.11 02/13/2019   BUN 12 02/13/2019   NA 138 02/13/2019   K 5.1 02/13/2019   CL 100 02/13/2019   CO2 24 02/13/2019   Lab Results  Component Value Date   CHOL 153 02/13/2019   HDL 55 02/13/2019   LDLCALC 77 02/13/2019   TRIG 115 02/13/2019   CHOLHDL 4.0 06/10/2018   Lab Results  Component Value Date   TSH 1.180 07/12/2015   Lab Results  Component Value Date   HGBA1C 8.5 (H) 02/13/2019   Lab Results  Component Value Date   WBC 15.1 (H) 08/27/2015   HGB 15.6 08/27/2015   HCT 46.2 08/27/2015   MCV 91.9 08/27/2015   PLT 484 (H)  08/27/2015   Lab Results  Component Value Date   ALT 22 02/13/2019   AST 14 02/13/2019   ALKPHOS 73 02/13/2019   BILITOT 0.4 02/13/2019     Review of Systems  Constitutional: Negative for chills and fever.  HENT: Negative for drooling, ear discharge, ear pain and sore throat.   Respiratory: Negative for cough, shortness of breath and wheezing.   Cardiovascular: Negative for chest pain, palpitations and leg swelling.  Gastrointestinal: Negative for abdominal pain, blood in stool, constipation, diarrhea and nausea.  Endocrine: Negative for polydipsia.  Genitourinary: Negative for dysuria, frequency, hematuria and urgency.  Musculoskeletal: Negative for back pain, myalgias and neck pain.  Skin: Negative for rash.  Allergic/Immunologic: Negative for environmental allergies.  Neurological: Negative for dizziness and headaches.  Hematological: Does not bruise/bleed easily.  Psychiatric/Behavioral: Negative for suicidal ideas. The patient is not nervous/anxious.     Patient Active Problem List   Diagnosis Date Noted  . Benign prostatic hyperplasia without lower urinary tract symptoms 06/21/2017  . Special screening for malignant neoplasms, colon   . Benign neoplasm of transverse colon   . Benign neoplasm of ascending colon   . Obesity, Class I, BMI 30-34.9 07/12/2015  . Overweight 04/12/2015  . Left carotid stenosis 04/12/2015  . Tinea corporis 10/03/2014  . Type 2 diabetes mellitus with diabetic neuropathy, without long-term current use of insulin ([place]) 04/26/2014  . HLD (hyperlipidemia) 04/26/2014  . HTN (hypertension) 04/26/2014  . H/O splenectomy 04/26/2014  . Anterior circulation transient ischemic attack 04/26/2014  . WPW (Wolff-Parkinson-White syndrome)  07/11/2012    No Known Allergies  Past Surgical History:  Procedure Laterality Date  . CARDIAC CATHETERIZATION     over 20 yrs ago.  no issues per pt.  . COLONOSCOPY WITH PROPOFOL N/A 02/12/2017   Procedure:  COLONOSCOPY WITH PROPOFOL;  Surgeon: Lucilla Lame, MD;  Location: [place];  Service: Endoscopy;  Laterality: N/A;  Diabetic - oral meds  . FOOT SURGERY    . HERNIA REPAIR     x2  . POLYPECTOMY  02/12/2017   Procedure: POLYPECTOMY;  Surgeon: Lucilla Lame, MD;  Location: [place];  Service: Endoscopy;;  . SPLENECTOMY      Social History   Tobacco Use  . Smoking status: Former Smoker    Types: Cigarettes    Quit date: 1997    Years since quitting: 24.7  . Smokeless tobacco: Never Used  Vaping Use  . Vaping Use: Never used  Substance Use Topics  . Alcohol use: No    Alcohol/week: 0.0 standard drinks  . Drug use: No     Medication list has been reviewed and updated.  Current Meds  Medication Sig  . amLODipine (NORVASC) 5 MG tablet Take 1 tablet (5 mg total) by mouth daily.  Marland Kitchen aspirin 81 MG tablet Take 81 mg by mouth daily.  Marland Kitchen atorvastatin (LIPITOR) 40 MG tablet Take 1 tablet (40 mg total) by mouth daily.  Marland Kitchen BAYER CONTOUR TEST test strip USE ONE STRIP TO CHECK GLUCOSE ONCE DAILY  . blood glucose meter kit and supplies Dispense based on patient and insurance preference. Use up to four times daily as directed. (FOR ICD-9 250.00, 250.01).  . finasteride (PROSCAR) 5 MG tablet Take 1 tablet (5 mg total) by mouth daily.  Marland Kitchen glipiZIDE (GLUCOTROL XL) 10 MG 24 hr tablet Take 1 tablet (10 mg total) by mouth daily with breakfast.  . lisinopril (ZESTRIL) 20 MG tablet Take 1 tablet (20 mg total) by mouth daily.  . metFORMIN (GLUCOPHAGE) 500 MG tablet Take 1 tablet (500 mg total) by mouth daily with breakfast.  . MICROLET LANCETS MISC USE ONE  TO CHECK GLUCOSE ONCE DAILY  . pioglitazone (ACTOS) 30 MG tablet Take 1 tablet (30 mg total) by mouth daily.  . tamsulosin (FLOMAX) 0.4 MG CAPS capsule Take 1 capsule by mouth once daily    PHQ 2/9 Scores 07/11/2019 04/06/2019 04/04/2019 08/29/2018  PHQ - 2 Score 0 0 0 0  PHQ- 9 Score 0 9 0 -    GAD 7 : Generalized Anxiety Score  07/11/2019 04/06/2019 04/04/2019  Nervous, Anxious, on Edge 0 0 0  Control/stop worrying 0 0 0  Worry too much - different things 0 0 0  Trouble relaxing 0 0 0  Restless 0 0 0  Easily annoyed or irritable 0 0 0  Afraid - awful might happen 0 0 0  Total GAD 7 Score 0 0 0  Anxiety Difficulty - Not difficult at all -    BP Readings from Last 3 Encounters:  10/23/19 130/80  07/11/19 130/80  04/13/19 124/68    Physical Exam Vitals and nursing note reviewed.  HENT:     Head: Normocephalic.     Right Ear: Tympanic membrane, ear canal and external ear normal. There is no impacted cerumen.     Left Ear: Tympanic membrane, ear canal and external ear normal. There is no impacted cerumen.     Nose: Nose normal. No congestion or rhinorrhea.     Mouth/Throat:     Mouth:  Mucous membranes are dry.     Pharynx: Oropharynx is clear. No oropharyngeal exudate or posterior oropharyngeal erythema.  Eyes:     General: No scleral icterus.       Right eye: No discharge.        Left eye: No discharge.     Extraocular Movements: Extraocular movements intact.     Conjunctiva/sclera: Conjunctivae normal.     Pupils: Pupils are equal, round, and reactive to light.  Neck:     Thyroid: No thyromegaly.     Vascular: No JVD.     Trachea: No tracheal deviation.  Cardiovascular:     Rate and Rhythm: Normal rate and regular rhythm.     Heart sounds: Normal heart sounds. No murmur heard.  No friction rub. No gallop.   Pulmonary:     Effort: No respiratory distress.     Breath sounds: Normal breath sounds. No wheezing, rhonchi or rales.  Abdominal:     General: Bowel sounds are normal.     Palpations: Abdomen is soft. There is no mass.     Tenderness: There is no abdominal tenderness. There is no right CVA tenderness, left CVA tenderness, guarding or rebound.  Musculoskeletal:        General: No tenderness. Normal range of motion.     Cervical back: Normal range of motion and neck supple.    Lymphadenopathy:     Cervical: No cervical adenopathy.  Skin:    General: Skin is warm.     Findings: No rash.  Neurological:     Mental Status: He is alert and oriented to person, place, and time.     Cranial Nerves: No cranial nerve deficit.     Deep Tendon Reflexes: Reflexes are normal and symmetric.     Wt Readings from Last 3 Encounters:  10/23/19 184 lb (83.5 kg)  07/11/19 176 lb (79.8 kg)  04/13/19 180 lb (81.6 kg)    BP 130/80   Pulse 64   Ht 5' 7" (1.702 m)   Wt 184 lb (83.5 kg)   BMI 28.82 kg/m   Assessment and Plan:  1. Essential hypertension Chronic.  Controlled.  Stable.  Patient currently tolerating regimen of lisinopril 20 mg once a day and amlodipine 5 mg once a day for which we will continue.  We will check a renal function panel for electrolytes and GFR. - HgB A1c - Renal Function Panel - lisinopril (ZESTRIL) 20 MG tablet; Take 1 tablet (20 mg total) by mouth daily.  Dispense: 90 tablet; Refill: 1 - amLODipine (NORVASC) 5 MG tablet; Take 1 tablet (5 mg total) by mouth daily.  Dispense: 90 tablet; Refill: 1  2. Type 2 diabetes mellitus with diabetic neuropathy, without long-term current use of insulin (HCC) Chronic.  Controlled.  Stable.  Unfortunately patient has not been able to afford the zig duo medication and has had to revert back to the original glipizide XL 10 mg and we will increase Actos 30 mg once a day.  We will check A1c and recheck in 4 to 6 months pending A1c reading. - HgB A1c - pioglitazone (ACTOS) 30 MG tablet; Take 1 tablet (30 mg total) by mouth daily.  Dispense: 90 tablet; Refill: 1 - glipiZIDE (GLUCOTROL XL) 10 MG 24 hr tablet; Take 1 tablet (10 mg total) by mouth daily with breakfast.  Dispense: 90 tablet; Refill: 1  3. Mixed hyperlipidemia Chronic.  Controlled.  Stable.  Continue atorvastatin 40 mg once a day. - atorvastatin (LIPITOR) 40  MG tablet; Take 1 tablet (40 mg total) by mouth daily.  Dispense: 90 tablet; Refill: 1  4.  Benign prostatic hyperplasia without lower urinary tract symptoms Chronic.  Controlled.  Stable.  Continue finasteride 5 mg and tamsulosin 0.4 mg once a day. - finasteride (PROSCAR) 5 MG tablet; Take 1 tablet (5 mg total) by mouth daily.  Dispense: 90 tablet; Refill: 1 - tamsulosin (FLOMAX) 0.4 MG CAPS capsule; Take 1 capsule (0.4 mg total) by mouth daily.  Dispense: 90 capsule; Refill: 1  5. Need for immunization against influenza Discussed and administered. - Flu Vaccine QUAD 36+ mos IM

## 2019-10-24 ENCOUNTER — Other Ambulatory Visit: Payer: Self-pay

## 2019-10-24 ENCOUNTER — Telehealth: Payer: Self-pay

## 2019-10-24 DIAGNOSIS — E1140 Type 2 diabetes mellitus with diabetic neuropathy, unspecified: Secondary | ICD-10-CM

## 2019-10-24 LAB — RENAL FUNCTION PANEL
Albumin: 4.4 g/dL (ref 3.8–4.9)
BUN/Creatinine Ratio: 9 (ref 9–20)
BUN: 11 mg/dL (ref 6–24)
CO2: 24 mmol/L (ref 20–29)
Calcium: 10.1 mg/dL (ref 8.7–10.2)
Chloride: 103 mmol/L (ref 96–106)
Creatinine, Ser: 1.23 mg/dL (ref 0.76–1.27)
GFR calc Af Amer: 74 mL/min/{1.73_m2} (ref >59)
GFR calc non Af Amer: 64 mL/min/{1.73_m2} (ref >59)
Glucose: 150 mg/dL — ABNORMAL HIGH (ref 65–99)
Phosphorus: 3.1 mg/dL (ref 2.8–4.1)
Potassium: 4.7 mmol/L (ref 3.5–5.2)
Sodium: 139 mmol/L (ref 134–144)

## 2019-10-24 LAB — HEMOGLOBIN A1C
Est. average glucose Bld gHb Est-mCnc: 189 mg/dL
Hgb A1c MFr Bld: 8.2 % — ABNORMAL HIGH (ref 4.8–5.6)

## 2019-10-24 MED ORDER — METFORMIN HCL 1000 MG PO TABS: 1000.0000 mg | ORAL_TABLET | Freq: Two times a day (BID) | ORAL | Status: DC

## 2019-10-24 NOTE — Telephone Encounter (Signed)
Copied from [place] [id number]. Topic: [employer] and exercise psychologist Patient (Clinic Use ONLY) >> Oct 24, 2019  7:59 AM Lennox Solders wrote: Reason for CRM: pt is returning tara call concerning blood work results

## 2019-10-24 NOTE — Telephone Encounter (Signed)
Called with lab results- no answer

## 2019-11-29 DIAGNOSIS — Z713 Dietary counseling and surveillance: Secondary | ICD-10-CM | POA: Diagnosis not present

## 2020-01-01 LAB — HM DIABETES EYE EXAM

## 2020-02-06 ENCOUNTER — Ambulatory Visit (INDEPENDENT_AMBULATORY_CARE_PROVIDER_SITE_OTHER): Payer: BC Managed Care – PPO | Admitting: Family Medicine

## 2020-02-06 ENCOUNTER — Other Ambulatory Visit: Payer: Self-pay

## 2020-02-06 ENCOUNTER — Encounter: Payer: Self-pay | Admitting: Family Medicine

## 2020-02-06 VITALS — BP 142/86 | HR 98 | Ht 67.0 in | Wt 181.0 lb

## 2020-02-06 DIAGNOSIS — I700 Atherosclerosis of aorta: Secondary | ICD-10-CM | POA: Diagnosis not present

## 2020-02-06 DIAGNOSIS — E1140 Type 2 diabetes mellitus with diabetic neuropathy, unspecified: Secondary | ICD-10-CM | POA: Diagnosis not present

## 2020-02-06 DIAGNOSIS — R9389 Abnormal findings on diagnostic imaging of other specified body structures: Secondary | ICD-10-CM | POA: Diagnosis not present

## 2020-02-06 NOTE — Progress Notes (Signed)
Date:  02/06/2020   Name:  Melvin KARDELL Sr.   DOB:  19-Jan-1960   MRN:  [id number]   Chief Complaint: Diabetes (Needs foot exam)  Diabetes He presents for his follow-up diabetic visit. He has type 2 diabetes mellitus. His disease course has been stable. There are no hypoglycemic associated symptoms. Pertinent negatives for hypoglycemia include no dizziness, headaches or nervousness/anxiousness. There are no diabetic associated symptoms. Pertinent negatives for diabetes include no blurred vision, no chest pain, no fatigue, no foot paresthesias, no foot ulcerations, no polydipsia, no polyphagia, no polyuria, no visual change, no weakness and no weight loss. There are no hypoglycemic complications. Symptoms are stable. There are no diabetic complications. Risk factors for coronary artery disease include hypertension and male sex. Current diabetic treatment includes oral agent (triple therapy). He is compliant with treatment most of the time. He is following a generally healthy diet. Meal planning includes avoidance of concentrated sweets and carbohydrate counting. He participates in exercise intermittently. An ACE inhibitor/angiotensin II receptor blocker is being taken. He does not see a podiatrist.Eye exam is not current.    Lab Results  Component Value Date   CREATININE 1.23 10/23/2019   BUN 11 10/23/2019   NA 139 10/23/2019   K 4.7 10/23/2019   CL 103 10/23/2019   CO2 24 10/23/2019   Lab Results  Component Value Date   CHOL 153 02/13/2019   HDL 55 02/13/2019   LDLCALC 77 02/13/2019   TRIG 115 02/13/2019   CHOLHDL 4.0 06/10/2018   Lab Results  Component Value Date   TSH 1.180 07/12/2015   Lab Results  Component Value Date   HGBA1C 8.2 (H) 10/23/2019   Lab Results  Component Value Date   WBC 15.1 (H) 08/27/2015   HGB 15.6 08/27/2015   HCT 46.2 08/27/2015   MCV 91.9 08/27/2015   PLT 484 (H) 08/27/2015   Lab Results  Component Value Date   ALT 22 02/13/2019   AST 14  02/13/2019   ALKPHOS 73 02/13/2019   BILITOT 0.4 02/13/2019     Review of Systems  Constitutional: Negative for chills, fatigue, fever and weight loss.  HENT: Negative for drooling, ear discharge, ear pain and sore throat.   Eyes: Negative for blurred vision.  Respiratory: Negative for cough, shortness of breath and wheezing.   Cardiovascular: Negative for chest pain, palpitations and leg swelling.  Gastrointestinal: Negative for abdominal pain, blood in stool, constipation, diarrhea and nausea.  Endocrine: Negative for polydipsia, polyphagia and polyuria.  Genitourinary: Negative for dysuria, frequency, hematuria and urgency.  Musculoskeletal: Negative for back pain, myalgias and neck pain.  Skin: Negative for rash.  Allergic/Immunologic: Negative for environmental allergies.  Neurological: Negative for dizziness, weakness and headaches.  Hematological: Does not bruise/bleed easily.  Psychiatric/Behavioral: Negative for suicidal ideas. The patient is not nervous/anxious.     Patient Active Problem List   Diagnosis Date Noted  . Benign prostatic hyperplasia without lower urinary tract symptoms 06/21/2017  . Special screening for malignant neoplasms, colon   . Benign neoplasm of transverse colon   . Benign neoplasm of ascending colon   . Obesity, Class I, BMI 30-34.9 07/12/2015  . Overweight 04/12/2015  . Left carotid stenosis 04/12/2015  . Tinea corporis 10/03/2014  . Type 2 diabetes mellitus with diabetic neuropathy, without long-term current use of insulin ([place]) 04/26/2014  . HLD (hyperlipidemia) 04/26/2014  . HTN (hypertension) 04/26/2014  . H/O splenectomy 04/26/2014  . Anterior circulation transient ischemic attack 04/26/2014  .  WPW (Wolff-Parkinson-White syndrome) 07/11/2012    No Known Allergies  Past Surgical History:  Procedure Laterality Date  . CARDIAC CATHETERIZATION     over 20 yrs ago.  no issues per pt.  . COLONOSCOPY WITH PROPOFOL N/A 02/12/2017    Procedure: COLONOSCOPY WITH PROPOFOL;  Surgeon: Lucilla Lame, MD;  Location: [place];  Service: Endoscopy;  Laterality: N/A;  Diabetic - oral meds  . FOOT SURGERY    . HERNIA REPAIR     x2  . POLYPECTOMY  02/12/2017   Procedure: POLYPECTOMY;  Surgeon: Lucilla Lame, MD;  Location: [place];  Service: Endoscopy;;  . SPLENECTOMY      Social History   Tobacco Use  . Smoking status: Former Smoker    Types: Cigarettes    Quit date: 1997    Years since quitting: 25.0  . Smokeless tobacco: Never Used  Vaping Use  . Vaping Use: Never used  Substance Use Topics  . Alcohol use: No    Alcohol/week: 0.0 standard drinks  . Drug use: No     Medication list has been reviewed and updated.  Current Meds  Medication Sig  . amLODipine (NORVASC) 5 MG tablet Take 1 tablet (5 mg total) by mouth daily.  Marland Kitchen aspirin 81 MG tablet Take 81 mg by mouth daily.  Marland Kitchen atorvastatin (LIPITOR) 40 MG tablet Take 1 tablet (40 mg total) by mouth daily.  Marland Kitchen BAYER CONTOUR TEST test strip USE ONE STRIP TO CHECK GLUCOSE ONCE DAILY  . blood glucose meter kit and supplies Dispense based on patient and insurance preference. Use up to four times daily as directed. (FOR ICD-9 250.00, 250.01).  . finasteride (PROSCAR) 5 MG tablet Take 1 tablet (5 mg total) by mouth daily.  Marland Kitchen glipiZIDE (GLUCOTROL XL) 10 MG 24 hr tablet Take 1 tablet (10 mg total) by mouth daily with breakfast.  . lisinopril (ZESTRIL) 20 MG tablet Take 1 tablet (20 mg total) by mouth daily.  . metFORMIN (GLUCOPHAGE) 1000 MG tablet Take 1 tablet (1,000 mg total) by mouth 2 (two) times daily with a meal.  . MICROLET LANCETS MISC USE ONE  TO CHECK GLUCOSE ONCE DAILY  . pioglitazone (ACTOS) 30 MG tablet Take 1 tablet (30 mg total) by mouth daily.  . tamsulosin (FLOMAX) 0.4 MG CAPS capsule Take 1 capsule (0.4 mg total) by mouth daily.    PHQ 2/9 Scores 07/11/2019 04/06/2019 04/04/2019 08/29/2018  PHQ - 2 Score 0 0 0 0  PHQ- 9 Score 0 9 0 -     GAD 7 : Generalized Anxiety Score 07/11/2019 04/06/2019 04/04/2019  Nervous, Anxious, on Edge 0 0 0  Control/stop worrying 0 0 0  Worry too much - different things 0 0 0  Trouble relaxing 0 0 0  Restless 0 0 0  Easily annoyed or irritable 0 0 0  Afraid - awful might happen 0 0 0  Total GAD 7 Score 0 0 0  Anxiety Difficulty - Not difficult at all -    BP Readings from Last 3 Encounters:  02/06/20 (!) 142/86  10/23/19 130/80  07/11/19 130/80    Physical Exam Vitals and nursing note reviewed.  HENT:     Head: Normocephalic.     Right Ear: Tympanic membrane, ear canal and external ear normal. There is no impacted cerumen.     Left Ear: Tympanic membrane, ear canal and external ear normal. There is no impacted cerumen.     Nose: Nose normal. No congestion or rhinorrhea.  Mouth/Throat:     Mouth: Oropharynx is clear and moist. Mucous membranes are moist.  Eyes:     General: No scleral icterus.       Right eye: No discharge.        Left eye: No discharge.     Extraocular Movements: EOM normal.     Conjunctiva/sclera: Conjunctivae normal.     Pupils: Pupils are equal, round, and reactive to light.  Neck:     Thyroid: No thyromegaly.     Vascular: No JVD.     Trachea: No tracheal deviation.  Cardiovascular:     Rate and Rhythm: Normal rate and regular rhythm.     Pulses: Intact distal pulses.          Dorsalis pedis pulses are 2+ on the right side and 2+ on the left side.       Posterior tibial pulses are 2+ on the right side and 2+ on the left side.     Heart sounds: Normal heart sounds. No murmur heard. No friction rub. No gallop.   Pulmonary:     Effort: No respiratory distress.     Breath sounds: Normal breath sounds. No wheezing, rhonchi or rales.  Abdominal:     General: Bowel sounds are normal.     Palpations: Abdomen is soft. There is no hepatosplenomegaly or mass.     Tenderness: There is no abdominal tenderness. There is no CVA tenderness, guarding or  rebound.  Musculoskeletal:        General: No tenderness or edema. Normal range of motion.     Cervical back: Normal range of motion and neck supple.     Right foot: Normal range of motion. No deformity.     Left foot: No deformity.  Feet:     Right foot:     Protective Sensation: 10 sites tested. 10 sites sensed.     Skin integrity: No ulcer, blister, skin breakdown, erythema, warmth, callus, dry skin or fissure.     Toenail Condition: Right toenails are normal.     Left foot:     Protective Sensation: 10 sites tested. 10 sites sensed.     Skin integrity: No ulcer, blister, skin breakdown, erythema, warmth, callus, dry skin or fissure.     Toenail Condition: Left toenails are normal.  Lymphadenopathy:     Cervical: No cervical adenopathy.  Skin:    General: Skin is warm.     Capillary Refill: Capillary refill takes less than 2 seconds.     Findings: No rash.  Neurological:     Mental Status: He is alert and oriented to person, place, and time.     Cranial Nerves: No cranial nerve deficit.     Deep Tendon Reflexes: Strength normal and reflexes are normal and symmetric.     Wt Readings from Last 3 Encounters:  02/06/20 181 lb (82.1 kg)  10/23/19 184 lb (83.5 kg)  07/11/19 176 lb (79.8 kg)    BP (!) 142/86   Pulse 98   Ht '5\' 7"'  (1.702 m)   Wt 181 lb (82.1 kg)   BMI 28.35 kg/m   Assessment and Plan: 1. Type 2 diabetes mellitus with diabetic neuropathy, without long-term current use of insulin (HCC) Chronic.  Controlled.  Stable.  Patient's been unable to get on any of the newer medications because of cost and has returned to standby of pioglitazone 30 mg once a day, glipizide XL 10 mg once a day, and Metformin 1 g twice a  day.  Foot exam was normal we will check an A1c and microalbumin. - HgB A1c - Microalbumin, urine  2. Abnormal chest CT On review patient has had an abnormal CT scan in March 2021.  There is suggested that there would be a repeat of the CT and we will  order that today.  Pulmonary exam was unremarkable for all lung aspects normal exam on auscultation and percussion. - CT Chest Wo Contrast; Future  3. Aortic atherosclerosis (HCC) Chronic.  Controlled.  Stable.  We will control this by controlling diabetes, blood pressure, as well as lipid management.

## 2020-02-07 LAB — HEMOGLOBIN A1C
Est. average glucose Bld gHb Est-mCnc: 206 mg/dL
Hgb A1c MFr Bld: 8.8 % — ABNORMAL HIGH (ref 4.8–5.6)

## 2020-02-07 LAB — MICROALBUMIN, URINE: Microalbumin, Urine: 6 ug/mL

## 2020-02-12 ENCOUNTER — Telehealth: Payer: Self-pay

## 2020-02-12 ENCOUNTER — Other Ambulatory Visit: Payer: Self-pay

## 2020-02-12 ENCOUNTER — Ambulatory Visit
Admission: RE | Admit: 2020-02-12 | Discharge: 2020-02-12 | Disposition: A | Discharge status: Home / Self Care | Payer: BC Managed Care – PPO | Source: Ambulatory Visit | Attending: Family Medicine | Admitting: Family Medicine

## 2020-02-12 DIAGNOSIS — R0602 Shortness of breath: Secondary | ICD-10-CM | POA: Diagnosis not present

## 2020-02-12 DIAGNOSIS — R9389 Abnormal findings on diagnostic imaging of other specified body structures: Secondary | ICD-10-CM

## 2020-02-12 DIAGNOSIS — E1140 Type 2 diabetes mellitus with diabetic neuropathy, unspecified: Secondary | ICD-10-CM

## 2020-02-12 MED ORDER — GLIPIZIDE 5 MG PO TABS: 5.0000 mg | ORAL_TABLET | Freq: Every day | ORAL | 1 refills | Status: DC

## 2020-02-12 MED ORDER — PIOGLITAZONE HCL 45 MG PO TABS: 45.0000 mg | ORAL_TABLET | Freq: Every day | ORAL | 1 refills | Status: DC

## 2020-02-12 NOTE — Telephone Encounter (Signed)
Scheduled appt with endo for March 14th @ 4:00 in [facility]- called pt and told him

## 2020-04-03 ENCOUNTER — Other Ambulatory Visit: Payer: Self-pay | Admitting: Family Medicine

## 2020-05-13 ENCOUNTER — Other Ambulatory Visit: Payer: Self-pay | Admitting: Family Medicine

## 2020-05-13 DIAGNOSIS — E1140 Type 2 diabetes mellitus with diabetic neuropathy, unspecified: Secondary | ICD-10-CM

## 2020-05-13 NOTE — Telephone Encounter (Signed)
Notes to clinic:  Patient has upcoming appointment on 06/06/2020 Review for supply until then    Requested Prescriptions  Pending Prescriptions Disp Refills   metFORMIN (GLUCOPHAGE) 1000 MG tablet [Pharmacy Med Name: metFORMIN HCl 1000 MG Oral Tablet] 180 tablet 0    Sig: Take 1 tablet by mouth twice daily      Endocrinology:  Diabetes - Biguanides Failed - 05/13/2020  7:36 AM      Failed - HBA1C is between 0 and 7.9 and within 180 days    Hemoglobin A1C  Date Value Ref Range Status  05/10/2017 6.9  Final   Hgb A1c MFr Bld  Date Value Ref Range Status  02/06/2020 8.8 (H) 4.8 - 5.6 % Final    Comment:             Prediabetes: 5.7 - 6.4          Diabetes: >6.4          Glycemic control for adults with diabetes: <7.0           Passed - Cr in normal range and within 360 days    Creatinine  Date Value Ref Range Status  04/12/2011 1.33 (H) 0.60 - 1.30 mg/dL Final   Creatinine, Ser  Date Value Ref Range Status  10/23/2019 1.23 0.76 - 1.27 mg/dL Final          Passed - eGFR in normal range and within 360 days    EGFR (African American)  Date Value Ref Range Status  04/12/2011 >60 >6m/min Final   GFR calc Af Amer  Date Value Ref Range Status  10/23/2019 74 >59 mL/min/1.73 Final    Comment:    **[facility] currently reports eGFR in compliance with the current**   recommendations of the [employer] [facility] will   update reporting as new guidelines are published from the NKF-ASN   Task force.    EGFR (Non-African Amer.)  Date Value Ref Range Status  04/12/2011 >60 >614mmin Final    Comment:    eGFR values <6090min/1.73 m2 may be an indication of chronic kidney disease (CKD). Calculated eGFR, using the MRDR Study equation, is useful in  patients with stable renal function. The eGFR calculation will not be reliable in acutely ill patients when serum creatinine is changing rapidly. It is not useful in patients on dialysis. The eGFR calculation may not  be applicable to patients at the low and high extremes of body sizes, pregnant women, and vegetarians.    GFR calc non Af Amer  Date Value Ref Range Status  10/23/2019 64 >59 mL/min/1.73 Final          Passed - Valid encounter within last 6 months    Recent Outpatient Visits           3 months ago Type 2 diabetes mellitus with diabetic neuropathy, without long-term current use of insulin ([facility]CC[facility] Jon[facility]ine Patch[facility]   6 months ago Need for immunization against influenza   [facility]   10 months ago Annual physical exam   [facility]   1 year ago Lower respiratory tract infection due to COVID-19 virus   [facility]   1 year ago Lower respiratory tract infection due to COVID-19 virus   [facility]       Future Appointments  In 3 weeks Jones, Deanna C, MD [facility], [facility]               pioglitazone (ACTOS) 45 MG tablet [Pharmacy Med Name: Pioglitazone HCl 45 MG Oral Tablet] 30 tablet 0    Sig: TAKE 1 TABLET BY MOUTH ONCE DAILY *TAKE IN PLACE OF 30 MG*      Endocrinology:  Diabetes - Glitazones - pioglitazone Failed - 05/13/2020  7:36 AM      Failed - HBA1C is between 0 and 7.9 and within 180 days    Hemoglobin A1C  Date Value Ref Range Status  05/10/2017 6.9  Final   Hgb A1c MFr Bld  Date Value Ref Range Status  02/06/2020 8.8 (H) 4.8 - 5.6 % Final    Comment:             Prediabetes: 5.7 - 6.4          Diabetes: >6.4          Glycemic control for adults with diabetes: <7.0           Passed - Valid encounter within last 6 months    Recent Outpatient Visits           3 months ago Type 2 diabetes mellitus with diabetic neuropathy, without long-term current use of insulin (HCC)   [facility] Jones, Deanna C, MD   6 months ago Need for immunization against influenza   [facility]  Jones, Deanna C, MD   10 months ago Annual physical exam   [facility] Jones, Deanna C, MD   1 year ago Lower respiratory tract infection due to COVID-19 virus   [facility] Jones, Deanna C, MD   1 year ago Lower respiratory tract infection due to COVID-19 virus   [facility] Jones, Deanna C, MD       Future Appointments             In 3 weeks Jones, Deanna C, MD [facility]                   

## 2020-06-06 ENCOUNTER — Ambulatory Visit: Payer: BC Managed Care – PPO | Admitting: Family Medicine

## 2020-06-17 ENCOUNTER — Ambulatory Visit: Payer: Self-pay | Admitting: Family Medicine

## 2020-06-19 ENCOUNTER — Encounter: Payer: Self-pay | Admitting: Family Medicine

## 2020-06-19 ENCOUNTER — Ambulatory Visit (INDEPENDENT_AMBULATORY_CARE_PROVIDER_SITE_OTHER): Payer: PRIVATE HEALTH INSURANCE | Admitting: Family Medicine

## 2020-06-19 ENCOUNTER — Other Ambulatory Visit: Payer: Self-pay | Admitting: Family Medicine

## 2020-06-19 ENCOUNTER — Other Ambulatory Visit: Payer: Self-pay

## 2020-06-19 VITALS — BP 138/80 | HR 76 | Ht 67.0 in | Wt 183.0 lb

## 2020-06-19 DIAGNOSIS — E782 Mixed hyperlipidemia: Secondary | ICD-10-CM | POA: Diagnosis not present

## 2020-06-19 DIAGNOSIS — N400 Enlarged prostate without lower urinary tract symptoms: Secondary | ICD-10-CM

## 2020-06-19 DIAGNOSIS — I10 Essential (primary) hypertension: Secondary | ICD-10-CM | POA: Diagnosis not present

## 2020-06-19 MED ORDER — TAMSULOSIN HCL 0.4 MG PO CAPS: 0.4000 mg | ORAL_CAPSULE | Freq: Every day | ORAL | 1 refills | Status: DC

## 2020-06-19 MED ORDER — AMLODIPINE BESYLATE 5 MG PO TABS: 5.0000 mg | ORAL_TABLET | Freq: Every day | ORAL | 1 refills | Status: DC

## 2020-06-19 MED ORDER — LISINOPRIL 20 MG PO TABS: 20.0000 mg | ORAL_TABLET | Freq: Every day | ORAL | 1 refills | Status: DC

## 2020-06-19 MED ORDER — ATORVASTATIN CALCIUM 40 MG PO TABS: 40.0000 mg | ORAL_TABLET | Freq: Every day | ORAL | 1 refills | Status: DC

## 2020-06-19 NOTE — Patient Instructions (Signed)

## 2020-06-19 NOTE — Progress Notes (Signed)
Date:  06/19/2020   Name:  Melvin KALUZNY Sr.   DOB:  [birth year]   MRN:  [id number]   Chief Complaint: Hypertension, Hyperlipidemia, and Benign Prostatic Hypertrophy  Hypertension This is a chronic problem. The current episode started more than 1 year ago. The problem has been gradually improving since onset. The problem is controlled. Pertinent negatives include no anxiety, blurred vision, chest pain, headaches, malaise/fatigue, neck pain, orthopnea, palpitations, peripheral edema, PND, shortness of breath or sweats. There are no associated agents to hypertension. There are no known risk factors for coronary artery disease. Past treatments include ACE inhibitors and calcium channel blockers. The current treatment provides moderate improvement. There are no compliance problems.  There is no history of angina, kidney disease, CAD/MI, CVA, heart failure, left ventricular hypertrophy, PVD or retinopathy. There is no history of chronic renal disease, a hypertension causing med or renovascular disease.  Hyperlipidemia This is a chronic problem. The current episode started more than 1 year ago. The problem is controlled. Recent lipid tests were reviewed and are normal. He has no history of chronic renal disease, diabetes, hypothyroidism, liver disease, obesity or nephrotic syndrome. Pertinent negatives include no chest pain, focal sensory loss, focal weakness, leg pain, myalgias or shortness of breath. Current antihyperlipidemic treatment includes statins. The current treatment provides moderate improvement of lipids. There are no compliance problems.  There are no known risk factors for coronary artery disease.  Benign Prostatic Hypertrophy This is a chronic problem. The current episode started more than 1 year ago. The problem has been gradually improving since onset. Irritative symptoms include frequency and nocturia. Irritative symptoms do not include urgency. Obstructive symptoms do not include  dribbling, incomplete emptying, a slower stream, straining or a weak stream. Pertinent negatives include no chills, dysuria, genital pain, hematuria, hesitancy, nausea or vomiting.    Lab Results  Component Value Date   CREATININE 1.23 10/23/2019   BUN 11 10/23/2019   NA 139 10/23/2019   K 4.7 10/23/2019   CL 103 10/23/2019   CO2 24 10/23/2019   Lab Results  Component Value Date   CHOL 153 02/13/2019   HDL 55 02/13/2019   LDLCALC 77 02/13/2019   TRIG 115 02/13/2019   CHOLHDL 4.0 06/10/2018   Lab Results  Component Value Date   TSH 1.180 07/12/2015   Lab Results  Component Value Date   HGBA1C 8.8 (H) 02/06/2020   Lab Results  Component Value Date   WBC 15.1 (H) 08/27/2015   HGB 15.6 08/27/2015   HCT 46.2 08/27/2015   MCV 91.9 08/27/2015   PLT 484 (H) 08/27/2015   Lab Results  Component Value Date   ALT 22 02/13/2019   AST 14 02/13/2019   ALKPHOS 73 02/13/2019   BILITOT 0.4 02/13/2019     Review of Systems  Constitutional: Negative for chills, fever and malaise/fatigue.  HENT: Negative for drooling, ear discharge, ear pain and sore throat.   Eyes: Negative for blurred vision.  Respiratory: Negative for cough, shortness of breath and wheezing.   Cardiovascular: Negative for chest pain, palpitations, orthopnea, leg swelling and PND.  Gastrointestinal: Negative for abdominal pain, blood in stool, constipation, diarrhea, nausea and vomiting.  Endocrine: Negative for polydipsia.  Genitourinary: Positive for frequency and nocturia. Negative for dysuria, hematuria, hesitancy, incomplete emptying and urgency.  Musculoskeletal: Negative for back pain, myalgias and neck pain.  Skin: Negative for rash.  Allergic/Immunologic: Negative for environmental allergies.  Neurological: Negative for dizziness, focal weakness and headaches.  Hematological:  Does not bruise/bleed easily.  Psychiatric/Behavioral: Negative for suicidal ideas. The patient is not nervous/anxious.      Patient Active Problem List   Diagnosis Date Noted  . Benign prostatic hyperplasia without lower urinary tract symptoms 06/21/2017  . Special screening for malignant neoplasms, colon   . Benign neoplasm of transverse colon   . Benign neoplasm of ascending colon   . Obesity, Class I, BMI 30-34.9 07/12/2015  . Overweight 04/12/2015  . Left carotid stenosis 04/12/2015  . Tinea corporis 10/03/2014  . Type 2 diabetes mellitus with diabetic neuropathy, without long-term current use of insulin ([place]) 04/26/2014  . HLD (hyperlipidemia) 04/26/2014  . HTN (hypertension) 04/26/2014  . H/O splenectomy 04/26/2014  . Anterior circulation transient ischemic attack 04/26/2014  . WPW (Wolff-Parkinson-White syndrome) 07/11/2012    No Known Allergies  Past Surgical History:  Procedure Laterality Date  . CARDIAC CATHETERIZATION     over 20 yrs ago.  no issues per pt.  . COLONOSCOPY WITH PROPOFOL N/A 02/12/2017   Procedure: COLONOSCOPY WITH PROPOFOL;  Surgeon: Lucilla Lame, MD;  Location: [place];  Service: Endoscopy;  Laterality: N/A;  Diabetic - oral meds  . FOOT SURGERY    . HERNIA REPAIR     x2  . POLYPECTOMY  02/12/2017   Procedure: POLYPECTOMY;  Surgeon: Lucilla Lame, MD;  Location: [place];  Service: Endoscopy;;  . SPLENECTOMY      Social History   Tobacco Use  . Smoking status: Former Smoker    Types: Cigarettes    Quit date: 1997    Years since quitting: 25.4  . Smokeless tobacco: Never Used  Vaping Use  . Vaping Use: Never used  Substance Use Topics  . Alcohol use: No    Alcohol/week: 0.0 standard drinks  . Drug use: No     Medication list has been reviewed and updated.  Current Meds  Medication Sig  . amLODipine (NORVASC) 5 MG tablet Take 1 tablet (5 mg total) by mouth daily.  Marland Kitchen aspirin 81 MG tablet Take 81 mg by mouth daily.  Marland Kitchen atorvastatin (LIPITOR) 40 MG tablet Take 1 tablet (40 mg total) by mouth daily.  . blood glucose meter kit and  supplies Dispense based on patient and insurance preference. Use up to four times daily as directed. (FOR ICD-9 250.00, 250.01).  . CONTOUR NEXT TEST test strip USE TO CHECK BLOOD SUGAR ONCE DAILY  . glipiZIDE (GLUCOTROL XL) 10 MG 24 hr tablet Take 1 tablet (10 mg total) by mouth daily with breakfast.  . lisinopril (ZESTRIL) 20 MG tablet Take 1 tablet (20 mg total) by mouth daily.  . metFORMIN (GLUCOPHAGE) 1000 MG tablet Take 1 tablet by mouth twice daily  . MICROLET LANCETS MISC USE ONE  TO CHECK GLUCOSE ONCE DAILY  . pioglitazone (ACTOS) 45 MG tablet TAKE 1 TABLET BY MOUTH ONCE DAILY *TAKE IN PLACE OF 30 MG*  . tamsulosin (FLOMAX) 0.4 MG CAPS capsule Take 1 capsule (0.4 mg total) by mouth daily.    PHQ 2/9 Scores 06/19/2020 07/11/2019 04/06/2019 04/04/2019  PHQ - 2 Score 0 0 0 0  PHQ- 9 Score 0 0 9 0    GAD 7 : Generalized Anxiety Score 06/19/2020 07/11/2019 04/06/2019 04/04/2019  Nervous, Anxious, on Edge 0 0 0 0  Control/stop worrying 0 0 0 0  Worry too much - different things 0 0 0 0  Trouble relaxing 0 0 0 0  Restless 0 0 0 0  Easily annoyed or irritable 0  0 0 0  Afraid - awful might happen 0 0 0 0  Total GAD 7 Score 0 0 0 0  Anxiety Difficulty - - Not difficult at all -    BP Readings from Last 3 Encounters:  06/19/20 138/80  02/06/20 (!) 142/86  10/23/19 130/80    Physical Exam Vitals and nursing note reviewed.  HENT:     Head: Normocephalic.     Right Ear: Tympanic membrane, ear canal and external ear normal. There is no impacted cerumen.     Left Ear: Tympanic membrane, ear canal and external ear normal. There is no impacted cerumen.     Nose: Nose normal. No congestion or rhinorrhea.     Mouth/Throat:     Mouth: Mucous membranes are moist.  Eyes:     General: No scleral icterus.       Right eye: No discharge.        Left eye: No discharge.     Conjunctiva/sclera: Conjunctivae normal.     Pupils: Pupils are equal, round, and reactive to light.  Neck:     Thyroid: No  thyromegaly.     Vascular: No JVD.     Trachea: No tracheal deviation.  Cardiovascular:     Rate and Rhythm: Normal rate and regular rhythm.     Pulses:          Dorsalis pedis pulses are 2+ on the right side and 2+ on the left side.       Posterior tibial pulses are 2+ on the right side and 2+ on the left side.     Heart sounds: Normal heart sounds. No murmur heard. No friction rub. No gallop.   Pulmonary:     Effort: No respiratory distress.     Breath sounds: Normal breath sounds. No wheezing, rhonchi or rales.  Abdominal:     General: Bowel sounds are normal.     Palpations: Abdomen is soft. There is no mass.     Tenderness: There is no abdominal tenderness. There is no guarding or rebound.  Musculoskeletal:        General: No tenderness. Normal range of motion.     Cervical back: Normal range of motion and neck supple.  Feet:     Right foot:     Protective Sensation: 10 sites tested. 10 sites sensed.     Skin integrity: Skin integrity normal. No ulcer, blister, skin breakdown, erythema, warmth, callus, dry skin or fissure.     Toenail Condition: Right toenails are normal.     Left foot:     Protective Sensation: 10 sites tested. 10 sites sensed.     Skin integrity: Skin integrity normal. No ulcer, blister, skin breakdown, erythema, warmth, callus, dry skin or fissure.     Toenail Condition: Left toenails are normal.  Lymphadenopathy:     Cervical: No cervical adenopathy.  Skin:    General: Skin is warm.     Findings: No rash.  Neurological:     Mental Status: He is alert and oriented to person, place, and time.     Cranial Nerves: No cranial nerve deficit.     Deep Tendon Reflexes: Reflexes are normal and symmetric.     Wt Readings from Last 3 Encounters:  06/19/20 183 lb (83 kg)  02/06/20 181 lb (82.1 kg)  10/23/19 184 lb (83.5 kg)    BP 138/80   Pulse 76   Ht _0  (1.702 m)   Wt 183 lb (83 kg)  BMI 28.66 kg/m   Assessment and Plan: 1. Essential  hypertension Chronic.  Controlled.  Stable.  Pressure today 138/80.  Continue amlodipine 5 mg once a day.  And lisinopril 20 mg once a day.  Will check CMP for electrolytes and GFR. - amLODipine (NORVASC) 5 MG tablet; Take 1 tablet (5 mg total) by mouth daily.  Dispense: 90 tablet; Refill: 1 - lisinopril (ZESTRIL) 20 MG tablet; Take 1 tablet (20 mg total) by mouth daily.  Dispense: 90 tablet; Refill: 1 - Comprehensive Metabolic Panel (CMET)  2. Mixed hyperlipidemia Chronic.  Controlled.  Stable.  Continue atorvastatin 40 mg once a day. - atorvastatin (LIPITOR) 40 MG tablet; Take 1 tablet (40 mg total) by mouth daily.  Dispense: 90 tablet; Refill: 1 - Lipid Panel With LDL/HDL Ratio  3. Benign prostatic hyperplasia without lower urinary tract symptoms Chronic.  Controlled.  Stable.  Continue tamsulosin 0.4 mg once a day.  Will check PSA. - tamsulosin (FLOMAX) 0.4 MG CAPS capsule; Take 1 capsule (0.4 mg total) by mouth daily.  Dispense: 90 capsule; Refill: 1

## 2020-06-20 LAB — PSA: Prostate Specific Ag, Serum: 0.7 ng/mL (ref 0.0–4.0)

## 2020-06-20 LAB — COMPREHENSIVE METABOLIC PANEL
ALT: 16 IU/L (ref 0–44)
AST: 18 IU/L (ref 0–40)
Albumin/Globulin Ratio: 1.6 (ref 1.2–2.2)
Albumin: 4.2 g/dL (ref 3.8–4.9)
Alkaline Phosphatase: 70 IU/L (ref 44–121)
BUN/Creatinine Ratio: 10 (ref 10–24)
BUN: 13 mg/dL (ref 8–27)
Bilirubin Total: 0.2 mg/dL (ref 0.0–1.2)
CO2: 21 mmol/L (ref 20–29)
Calcium: 9.6 mg/dL (ref 8.6–10.2)
Chloride: 103 mmol/L (ref 96–106)
Creatinine, Ser: 1.32 mg/dL — ABNORMAL HIGH (ref 0.76–1.27)
Globulin, Total: 2.6 g/dL (ref 1.5–4.5)
Glucose: 80 mg/dL (ref 65–99)
Potassium: 4.3 mmol/L (ref 3.5–5.2)
Sodium: 138 mmol/L (ref 134–144)
Total Protein: 6.8 g/dL (ref 6.0–8.5)
eGFR: 62 mL/min/{1.73_m2} (ref >59)

## 2020-07-01 ENCOUNTER — Other Ambulatory Visit: Payer: Self-pay | Admitting: Family Medicine

## 2020-07-01 DIAGNOSIS — E1140 Type 2 diabetes mellitus with diabetic neuropathy, unspecified: Secondary | ICD-10-CM

## 2020-07-01 NOTE — Telephone Encounter (Signed)
Needs to be filled by Dr Honor Junes Warnell Forester

## 2020-07-01 NOTE — Telephone Encounter (Signed)
Patient called again to ask if he could get his medication today since he is all out.  Please advise.

## 2020-07-01 NOTE — Telephone Encounter (Signed)
Notes to clinic:  Patient is out of medication  Advised for refill Medication was last filled on 05/13/2020 for 30 day    Requested Prescriptions  Pending Prescriptions Disp Refills   metFORMIN (GLUCOPHAGE) 1000 MG tablet [Pharmacy Med Name: metFORMIN HCl 1000 MG Oral Tablet] 60 tablet 0    Sig: Take 1 tablet by mouth twice daily      Endocrinology:  Diabetes - Biguanides Failed - 07/01/2020  9:42 AM      Failed - Cr in normal range and within 360 days    Creatinine  Date Value Ref Range Status  04/12/2011 1.33 (H) 0.60 - 1.30 mg/dL Final   Creatinine, Ser  Date Value Ref Range Status  06/19/2020 1.32 (H) 0.76 - 1.27 mg/dL Final          Failed - HBA1C is between 0 and 7.9 and within 180 days    Hemoglobin A1C  Date Value Ref Range Status  05/10/2017 6.9  Final   Hgb A1c MFr Bld  Date Value Ref Range Status  02/06/2020 8.8 (H) 4.8 - 5.6 % Final    Comment:             Prediabetes: 5.7 - 6.4          Diabetes: >6.4          Glycemic control for adults with diabetes: <7.0           Passed - eGFR in normal range and within 360 days    EGFR (African American)  Date Value Ref Range Status  04/12/2011 >60 >58m/min Final   GFR calc Af Amer  Date Value Ref Range Status  10/23/2019 74 >59 mL/min/1.73 Final    Comment:    **[facility] currently reports eGFR in compliance with the current**   recommendations of the [employer] [facility] will   update reporting as new guidelines are published from the NKF-ASN   Task force.    EGFR (Non-African Amer.)  Date Value Ref Range Status  04/12/2011 >60 >614mmin Final    Comment:    eGFR values <6040min/1.73 m2 may be an indication of chronic kidney disease (CKD). Calculated eGFR, using the MRDR Study equation, is useful in  patients with stable renal function. The eGFR calculation will not be reliable in acutely ill patients when serum creatinine is changing rapidly. It is not useful in patients on dialysis.  The eGFR calculation may not be applicable to patients at the low and high extremes of body sizes, pregnant women, and vegetarians.    GFR calc non Af Amer  Date Value Ref Range Status  10/23/2019 64 >59 mL/min/1.73 Final   eGFR  Date Value Ref Range Status  06/19/2020 62 >59 mL/min/1.73 Final          Passed - Valid encounter within last 6 months    Recent Outpatient Visits           1 week ago Essential hypertension   [facility]   4 months ago Type 2 diabetes mellitus with diabetic neuropathy, without long-term current use of insulin ([facility]   8 months ago Need for immunization against influenza   [facility]   11 months ago Annual physical exam   [facility]   1 year ago Lower respiratory tract infection due to COVID-19 virus   [facility]  Iven Finn, MD       Future Appointments             In 5 months Juline Patch, MD [facility], [facility]

## 2020-07-08 ENCOUNTER — Other Ambulatory Visit: Payer: Self-pay | Admitting: Family Medicine

## 2020-07-08 DIAGNOSIS — E1140 Type 2 diabetes mellitus with diabetic neuropathy, unspecified: Secondary | ICD-10-CM

## 2020-08-04 ENCOUNTER — Other Ambulatory Visit: Payer: Self-pay | Admitting: Family Medicine

## 2020-08-04 DIAGNOSIS — E1140 Type 2 diabetes mellitus with diabetic neuropathy, unspecified: Secondary | ICD-10-CM

## 2020-08-04 NOTE — Telephone Encounter (Signed)
Requested medication (s) are due for refill today: yes  Requested medication (s) are on the active medication list: yes  Last refill:  05/13/20 #30   Future visit scheduled: yes in 3 months  Notes to clinic:  overdue labs   Requested Prescriptions  Pending Prescriptions Disp Refills   pioglitazone (ACTOS) 45 MG tablet [Pharmacy Med Name: Pioglitazone HCl 45 MG Oral Tablet] 30 tablet 0    Sig: TAKE 1 TABLET BY MOUTH ONCE DAILY. KEEP APPOINTMENT FOR MAY.      Endocrinology:  Diabetes - Glitazones - pioglitazone Failed - 08/04/2020 11:48 AM      Failed - HBA1C is between 0 and 7.9 and within 180 days    Hemoglobin A1C  Date Value Ref Range Status  05/10/2017 6.9  Final   Hgb A1c MFr Bld  Date Value Ref Range Status  02/06/2020 8.8 (H) 4.8 - 5.6 % Final    Comment:             Prediabetes: 5.7 - 6.4          Diabetes: >6.4          Glycemic control for adults with diabetes: <7.0           Passed - Valid encounter within last 6 months    Recent Outpatient Visits           1 month ago Essential hypertension   [place], MD   6 months ago Type 2 diabetes mellitus with diabetic neuropathy, without long-term current use of insulin ([place])   [facility] Juline Patch, MD   9 months ago Need for immunization against influenza   [facility], [facility]   1 year ago Annual physical exam   [facility] Juline Patch, MD   1 year ago Lower respiratory tract infection due to COVID-19 virus   [place], Deanna C, MD       Future Appointments             In 3 months Juline Patch, MD [facility], [facility]

## 2020-09-09 ENCOUNTER — Other Ambulatory Visit: Payer: Self-pay | Admitting: Family Medicine

## 2020-09-09 DIAGNOSIS — E1140 Type 2 diabetes mellitus with diabetic neuropathy, unspecified: Secondary | ICD-10-CM

## 2020-09-09 DIAGNOSIS — N400 Enlarged prostate without lower urinary tract symptoms: Secondary | ICD-10-CM

## 2020-10-29 ENCOUNTER — Other Ambulatory Visit: Payer: Self-pay | Admitting: Family Medicine

## 2020-10-29 DIAGNOSIS — E1140 Type 2 diabetes mellitus with diabetic neuropathy, unspecified: Secondary | ICD-10-CM

## 2020-10-29 DIAGNOSIS — I10 Essential (primary) hypertension: Secondary | ICD-10-CM

## 2020-10-29 NOTE — Telephone Encounter (Signed)
Requested Prescriptions  Pending Prescriptions Disp Refills  . amLODipine (NORVASC) 5 MG tablet [Pharmacy Med Name: amLODIPine Besylate 5 MG Oral Tablet] 90 tablet 0    Sig: Take 1 tablet by mouth once daily     Cardiovascular:  Calcium Channel Blockers Passed - 10/29/2020 11:03 AM      Passed - Last BP in normal range    BP Readings from Last 1 Encounters:  06/19/20 138/80         Passed - Valid encounter within last 6 months    Recent Outpatient Visits          4 months ago Essential hypertension   [place], MD   8 months ago Type 2 diabetes mellitus with diabetic neuropathy, without long-term current use of insulin ([place])   [facility] Juline Patch, MD   1 year ago Need for immunization against influenza   [facility] Juline Patch, MD   1 year ago Annual physical exam   [facility] Juline Patch, MD   1 year ago Lower respiratory tract infection due to COVID-19 virus   [place], MD      Future Appointments            In 1 month Juline Patch, MD [facility], [place]           . glipiZIDE (GLUCOTROL XL) 10 MG 24 hr tablet [Pharmacy Med Name: glipiZIDE ER 10 MG Oral Tablet Extended Release 24 Hour] 30 tablet 0    Sig: Take 1 tablet by mouth once daily with breakfast     Endocrinology:  Diabetes - Sulfonylureas Failed - 10/29/2020 11:03 AM      Failed - HBA1C is between 0 and 7.9 and within 180 days    Hemoglobin A1C  Date Value Ref Range Status  05/10/2017 6.9  Final   Hgb A1c MFr Bld  Date Value Ref Range Status  02/06/2020 8.8 (H) 4.8 - 5.6 % Final    Comment:             Prediabetes: 5.7 - 6.4          Diabetes: >6.4          Glycemic control for adults with diabetes: <7.0          Passed - Valid encounter within last 6 months    Recent Outpatient Visits          4 months ago Essential hypertension   [place], MD   8 months  ago Type 2 diabetes mellitus with diabetic neuropathy, without long-term current use of insulin ([place])   [facility] Juline Patch, MD   1 year ago Need for immunization against influenza   [facility] Juline Patch, MD   1 year ago Annual physical exam   [facility] Juline Patch, MD   1 year ago Lower respiratory tract infection due to COVID-19 virus   [place], MD      Future Appointments            In 1 month Juline Patch, MD [facility], [facility]

## 2020-11-22 LAB — HEMOGLOBIN A1C: Hemoglobin A1C: 9.6

## 2020-11-22 LAB — LIPID PANEL
Cholesterol: 183 (ref 0–200)
HDL: 56 (ref 35–70)
LDL Cholesterol: 90
Triglycerides: 184 — AB (ref 40–160)

## 2020-11-22 LAB — BASIC METABOLIC PANEL
BUN: 21 (ref 4–21)
Creatinine: 1.3 (ref 0.6–1.3)
Glucose: 85

## 2020-11-22 LAB — HEPATIC FUNCTION PANEL
ALT: 20 U/L (ref 10–40)
AST: 14 (ref 14–40)

## 2020-11-22 LAB — TSH: TSH: 1.4 (ref 0.41–5.90)

## 2020-11-22 LAB — HM DIABETES FOOT EXAM: HM Diabetic Foot Exam: NORMAL

## 2020-11-22 LAB — MICROALBUMIN, URINE: Microalb, Ur: 150

## 2020-11-29 ENCOUNTER — Ambulatory Visit: Payer: PRIVATE HEALTH INSURANCE | Admitting: Family Medicine

## 2020-12-02 ENCOUNTER — Ambulatory Visit (INDEPENDENT_AMBULATORY_CARE_PROVIDER_SITE_OTHER): Payer: No Typology Code available for payment source | Admitting: Family Medicine

## 2020-12-02 ENCOUNTER — Encounter: Payer: Self-pay | Admitting: Family Medicine

## 2020-12-02 ENCOUNTER — Other Ambulatory Visit: Payer: Self-pay

## 2020-12-02 VITALS — BP 144/78 | HR 96 | Resp 17 | Ht 67.0 in | Wt 183.6 lb

## 2020-12-02 DIAGNOSIS — Z23 Encounter for immunization: Secondary | ICD-10-CM | POA: Diagnosis not present

## 2020-12-02 DIAGNOSIS — I10 Essential (primary) hypertension: Secondary | ICD-10-CM

## 2020-12-02 DIAGNOSIS — E782 Mixed hyperlipidemia: Secondary | ICD-10-CM | POA: Diagnosis not present

## 2020-12-02 DIAGNOSIS — N400 Enlarged prostate without lower urinary tract symptoms: Secondary | ICD-10-CM | POA: Diagnosis not present

## 2020-12-02 MED ORDER — AMLODIPINE BESYLATE 5 MG PO TABS: 5.0000 mg | ORAL_TABLET | Freq: Every day | ORAL | 1 refills | Status: DC

## 2020-12-02 MED ORDER — ATORVASTATIN CALCIUM 40 MG PO TABS: 40.0000 mg | ORAL_TABLET | Freq: Every day | ORAL | 1 refills | Status: DC

## 2020-12-02 MED ORDER — LISINOPRIL 20 MG PO TABS: 20.0000 mg | ORAL_TABLET | Freq: Every day | ORAL | 1 refills | Status: DC

## 2020-12-02 MED ORDER — TAMSULOSIN HCL 0.4 MG PO CAPS
0.4000 mg | ORAL_CAPSULE | Freq: Every day | ORAL | 1 refills | Status: DC
Start: 2020-12-02 — End: 2021-06-02

## 2020-12-02 NOTE — Progress Notes (Signed)
Date:  12/02/2020   Name:  Melvin SOBEL Sr.   DOB:  1960-09-01   MRN:  [id number]   Chief Complaint: Hypertension, Benign Prostatic Hypertrophy, and Diabetes (Pt was seen by Dr. Honor Junes on 11/22/20 - A1C 9.6%.  the pt was started on Farxiga. )  Hypertension This is a chronic problem. The current episode started more than 1 year ago. The problem has been gradually improving since onset. The problem is controlled. Pertinent negatives include no anxiety, chest pain, headaches, malaise/fatigue, neck pain, orthopnea, palpitations, peripheral edema, PND or shortness of breath. There are no associated agents to hypertension. Past treatments include ACE inhibitors and calcium channel blockers. The current treatment provides moderate improvement. There are no compliance problems.  There is no history of angina, kidney disease, CAD/MI, heart failure or left ventricular hypertrophy. There is no history of chronic renal disease, a hypertension causing med or renovascular disease.  Benign Prostatic Hypertrophy This is a chronic problem. The problem has been gradually improving since onset. Irritative symptoms include nocturia and urgency. Irritative symptoms do not include frequency. Obstructive symptoms do not include dribbling, an intermittent stream or a weak stream. Pertinent negatives include no chills, dysuria, hematuria, hesitancy or nausea. Past treatments include tamsulosin. The treatment provided moderate relief.  Hyperlipidemia He has no history of chronic renal disease, diabetes, hypothyroidism, liver disease, obesity or nephrotic syndrome. Pertinent negatives include no chest pain, focal sensory loss, focal weakness, leg pain, myalgias or shortness of breath.   Lab Results  Component Value Date   CREATININE 1.32 (H) 06/19/2020   BUN 13 06/19/2020   NA 138 06/19/2020   K 4.3 06/19/2020   CL 103 06/19/2020   CO2 21 06/19/2020   Lab Results  Component Value Date   CHOL 153 02/13/2019    HDL 55 02/13/2019   LDLCALC 77 02/13/2019   TRIG 115 02/13/2019   CHOLHDL 4.0 06/10/2018   Lab Results  Component Value Date   TSH 1.180 07/12/2015   Lab Results  Component Value Date   HGBA1C 8.8 (H) 02/06/2020   Lab Results  Component Value Date   WBC 15.1 (H) 08/27/2015   HGB 15.6 08/27/2015   HCT 46.2 08/27/2015   MCV 91.9 08/27/2015   PLT 484 (H) 08/27/2015   Lab Results  Component Value Date   ALT 16 06/19/2020   AST 18 06/19/2020   ALKPHOS 70 06/19/2020   BILITOT 0.2 06/19/2020   No components found for: VITD  Review of Systems  Constitutional:  Negative for chills, fever and malaise/fatigue.  HENT:  Negative for drooling, ear discharge, ear pain and sore throat.   Respiratory:  Negative for cough, shortness of breath and wheezing.   Cardiovascular:  Negative for chest pain, palpitations, orthopnea, leg swelling and PND.  Gastrointestinal:  Negative for abdominal pain, blood in stool, constipation, diarrhea and nausea.  Endocrine: Negative for polydipsia.  Genitourinary:  Positive for nocturia and urgency. Negative for dysuria, frequency, hematuria and hesitancy.  Musculoskeletal:  Negative for back pain, myalgias and neck pain.  Skin:  Negative for rash.  Allergic/Immunologic: Negative for environmental allergies.  Neurological:  Negative for dizziness, focal weakness and headaches.  Hematological:  Does not bruise/bleed easily.  Psychiatric/Behavioral:  Negative for suicidal ideas. The patient is not nervous/anxious.    Patient Active Problem List   Diagnosis Date Noted   Benign prostatic hyperplasia without lower urinary tract symptoms 06/21/2017   Special screening for malignant neoplasms, colon    Benign neoplasm of  transverse colon    Benign neoplasm of ascending colon    Obesity, Class I, BMI 30-34.9 07/12/2015   Overweight 04/12/2015   Left carotid stenosis 04/12/2015   Tinea corporis 10/03/2014   Type 2 diabetes mellitus with diabetic  neuropathy, without long-term current use of insulin ([place]) 04/26/2014   HLD (hyperlipidemia) 04/26/2014   HTN (hypertension) 04/26/2014   H/O splenectomy 04/26/2014   Anterior circulation transient ischemic attack 04/26/2014   WPW (Wolff-Parkinson-White syndrome) 07/11/2012    No Known Allergies  Past Surgical History:  Procedure Laterality Date   CARDIAC CATHETERIZATION     over 20 yrs ago.  no issues per pt.   COLONOSCOPY WITH PROPOFOL N/A 02/12/2017   Procedure: COLONOSCOPY WITH PROPOFOL;  Surgeon: Lucilla Lame, MD;  Location: [facility];  Service: Endoscopy;  Laterality: N/A;  Diabetic - oral meds   FOOT SURGERY     HERNIA REPAIR     x2   POLYPECTOMY  02/12/2017   Procedure: POLYPECTOMY;  Surgeon: Lucilla Lame, MD;  Location: [facility];  Service: Endoscopy;;   SPLENECTOMY      Social History   Tobacco Use   Smoking status: Former    Types: Cigarettes    Quit date: 1997    Years since quitting: 25.9   Smokeless tobacco: Never  Vaping Use   Vaping Use: Never used  Substance Use Topics   Alcohol use: No    Alcohol/week: 0.0 standard drinks   Drug use: No     Medication list has been reviewed and updated.  Current Meds  Medication Sig   amLODipine (NORVASC) 5 MG tablet Take 1 tablet by mouth once daily   aspirin 81 MG tablet Take 81 mg by mouth daily.   atorvastatin (LIPITOR) 40 MG tablet Take 1 tablet (40 mg total) by mouth daily.   blood glucose meter kit and supplies Dispense based on patient and insurance preference. Use up to four times daily as directed. (FOR ICD-9 250.00, 250.01).   CONTOUR NEXT TEST test strip USE TO CHECK BLOOD SUGAR ONCE DAILY   glipiZIDE (GLUCOTROL XL) 10 MG 24 hr tablet Take 1 tablet by mouth once daily with breakfast   lisinopril (ZESTRIL) 20 MG tablet Take 1 tablet (20 mg total) by mouth daily.   metFORMIN (GLUCOPHAGE) 1000 MG tablet Take 1 tablet by mouth twice daily   MICROLET LANCETS MISC USE ONE  TO CHECK GLUCOSE  ONCE DAILY   pioglitazone (ACTOS) 45 MG tablet TAKE 1 TABLET BY MOUTH ONCE DAILY. KEEP APPOINTMENT FOR MAY.   tamsulosin (FLOMAX) 0.4 MG CAPS capsule Take 1 capsule by mouth once daily    PHQ 2/9 Scores 12/02/2020 06/19/2020 07/11/2019 04/06/2019  PHQ - 2 Score 0 0 0 0  PHQ- 9 Score 0 0 0 9    GAD 7 : Generalized Anxiety Score 12/02/2020 06/19/2020 07/11/2019 04/06/2019  Nervous, Anxious, on Edge 0 0 0 0  Control/stop worrying 0 0 0 0  Worry too much - different things 0 0 0 0  Trouble relaxing 0 0 0 0  Restless 0 0 0 0  Easily annoyed or irritable 1 0 0 0  Afraid - awful might happen 0 0 0 0  Total GAD 7 Score 1 0 0 0  Anxiety Difficulty Not difficult at all - - Not difficult at all    BP Readings from Last 3 Encounters:  12/02/20 (!) 144/78  06/19/20 138/80  02/06/20 (!) 142/86    Physical Exam Vitals and nursing  note reviewed.  HENT:     Head: Normocephalic.     Right Ear: External ear normal.     Left Ear: External ear normal.     Nose: Nose normal.  Eyes:     General: No scleral icterus.       Right eye: No discharge.        Left eye: No discharge.     Conjunctiva/sclera: Conjunctivae normal.     Pupils: Pupils are equal, round, and reactive to light.  Neck:     Thyroid: No thyromegaly.     Vascular: No JVD.     Trachea: No tracheal deviation.  Cardiovascular:     Rate and Rhythm: Normal rate and regular rhythm.     Heart sounds: Normal heart sounds. No murmur heard.   No friction rub. No gallop.  Pulmonary:     Effort: No respiratory distress.     Breath sounds: Normal breath sounds. No wheezing or rales.  Abdominal:     General: Bowel sounds are normal.     Palpations: Abdomen is soft. There is no mass.     Tenderness: There is no abdominal tenderness. There is no guarding or rebound.  Musculoskeletal:        General: No tenderness. Normal range of motion.     Cervical back: Normal range of motion and neck supple.  Lymphadenopathy:     Cervical: No cervical  adenopathy.  Skin:    General: Skin is warm.     Findings: No rash.  Neurological:     Mental Status: He is alert and oriented to person, place, and time.     Cranial Nerves: No cranial nerve deficit.     Deep Tendon Reflexes: Reflexes are normal and symmetric.    Wt Readings from Last 3 Encounters:  12/02/20 183 lb 9.6 oz (83.3 kg)  06/19/20 183 lb (83 kg)  02/06/20 181 lb (82.1 kg)    BP (!) 144/78 (BP Location: Right Arm, Patient Position: Sitting, Cuff Size: Normal)   Pulse 96   Resp 17   Ht '5\' 7"'  (1.702 m)   Wt 183 lb 9.6 oz (83.3 kg)   SpO2 100%   BMI 28.76 kg/m   Assessment and Plan:  1. Benign prostatic hyperplasia without lower urinary tract symptoms Chronic.  Stable.  Controlled.  Symptoms are controlled reasonably with tamsulosin 0.4 mg once a day.  We have discussed the possibility of finasteride and we will hold at this time. - tamsulosin (FLOMAX) 0.4 MG CAPS capsule; Take 1 capsule (0.4 mg total) by mouth daily.  Dispense: 90 capsule; Refill: 1  2. Essential hypertension .  Controlled.  Stable.  Blood pressure today is 144/78.  We will continue on amlodipine 5 mg once a day and lisinopril 20 mg once a day. - amLODipine (NORVASC) 5 MG tablet; Take 1 tablet (5 mg total) by mouth daily.  Dispense: 90 tablet; Refill: 1 - lisinopril (ZESTRIL) 20 MG tablet; Take 1 tablet (20 mg total) by mouth daily.  Dispense: 90 tablet; Refill: 1  3. Mixed hyperlipidemia Chronic.  Controlled.  Stable.  Continue atorvastatin 40 mg once a day.  Reviewed previous lipid panel and which is recently acceptable at this time. - atorvastatin (LIPITOR) 40 MG tablet; Take 1 tablet (40 mg total) by mouth daily.  Dispense: 90 tablet; Refill: 1  4. Need for immunization against influenza Discussed and administered - Flu Vaccine QUAD 6+ mos PF IM (Fluarix Quad PF)

## 2020-12-09 ENCOUNTER — Other Ambulatory Visit: Payer: Self-pay | Admitting: Family Medicine

## 2020-12-09 DIAGNOSIS — E1140 Type 2 diabetes mellitus with diabetic neuropathy, unspecified: Secondary | ICD-10-CM

## 2021-01-27 ENCOUNTER — Other Ambulatory Visit: Payer: Self-pay | Admitting: Family Medicine

## 2021-01-27 DIAGNOSIS — E1140 Type 2 diabetes mellitus with diabetic neuropathy, unspecified: Secondary | ICD-10-CM

## 2021-01-27 NOTE — Telephone Encounter (Signed)
Requested Prescriptions  Pending Prescriptions Disp Refills   glipiZIDE (GLUCOTROL XL) 10 MG 24 hr tablet [Pharmacy Med Name: glipiZIDE ER 10 MG Oral Tablet Extended Release 24 Hour] 30 tablet 0    Sig: Take 1 tablet by mouth once daily with breakfast     Endocrinology:  Diabetes - Sulfonylureas Failed - 01/27/2021  4:14 PM      Failed - HBA1C is between 0 and 7.9 and within 180 days    Hemoglobin A1C  Date Value Ref Range Status  05/10/2017 6.9  Final   Hgb A1c MFr Bld  Date Value Ref Range Status  02/06/2020 8.8 (H) 4.8 - 5.6 % Final    Comment:             Prediabetes: 5.7 - 6.4          Diabetes: >6.4          Glycemic control for adults with diabetes: <7.0          Passed - Valid encounter within last 6 months    Recent Outpatient Visits          1 month ago Need for immunization against influenza   [place], Deanna C, MD   7 months ago Essential hypertension   [place], MD   11 months ago Type 2 diabetes mellitus with diabetic neuropathy, without long-term current use of insulin ([place])   [facility] Juline Patch, MD   1 year ago Need for immunization against influenza   [facility] Juline Patch, MD   1 year ago Annual physical exam   [place], Deanna C, MD      Future Appointments            In 4 months Juline Patch, MD [facility], [facility]

## 2021-03-10 ENCOUNTER — Other Ambulatory Visit: Payer: Self-pay | Admitting: Family Medicine

## 2021-03-10 DIAGNOSIS — E1140 Type 2 diabetes mellitus with diabetic neuropathy, unspecified: Secondary | ICD-10-CM

## 2021-03-11 NOTE — Telephone Encounter (Signed)
Requested Prescriptions  Pending Prescriptions Disp Refills   glipiZIDE (GLUCOTROL XL) 10 MG 24 hr tablet [Pharmacy Med Name: glipiZIDE ER 10 MG Oral Tablet Extended Release 24 Hour] 30 tablet 0    Sig: Take 1 tablet by mouth once daily with breakfast     Endocrinology:  Diabetes - Sulfonylureas Failed - 03/10/2021  9:30 AM      Failed - HBA1C is between 0 and 7.9 and within 180 days    Hemoglobin A1C  Date Value Ref Range Status  05/10/2017 6.9  Final   Hgb A1c MFr Bld  Date Value Ref Range Status  02/06/2020 8.8 (H) 4.8 - 5.6 % Final    Comment:             Prediabetes: 5.7 - 6.4          Diabetes: >6.4          Glycemic control for adults with diabetes: <7.0          Failed - Cr in normal range and within 360 days    Creatinine  Date Value Ref Range Status  04/12/2011 1.33 (H) 0.60 - 1.30 mg/dL Final   Creatinine, Ser  Date Value Ref Range Status  06/19/2020 1.32 (H) 0.76 - 1.27 mg/dL Final         Passed - Valid encounter within last 6 months    Recent Outpatient Visits          3 months ago Need for immunization against influenza   [place], Deanna C, MD   8 months ago Essential hypertension   [place], Deanna C, MD   1 year ago Type 2 diabetes mellitus with diabetic neuropathy, without long-term current use of insulin ([place])   [facility] Juline Patch, MD   1 year ago Need for immunization against influenza   [facility] Juline Patch, MD   1 year ago Annual physical exam   [place], Deanna C, MD      Future Appointments            In 2 months Juline Patch, MD [facility], [facility]

## 2021-05-13 ENCOUNTER — Other Ambulatory Visit: Payer: Self-pay | Admitting: Family Medicine

## 2021-05-13 DIAGNOSIS — E1140 Type 2 diabetes mellitus with diabetic neuropathy, unspecified: Secondary | ICD-10-CM

## 2021-05-14 ENCOUNTER — Ambulatory Visit: Payer: No Typology Code available for payment source | Admitting: Family Medicine

## 2021-05-14 NOTE — Telephone Encounter (Signed)
Requested medication (s) are due for refill today: yes ? ?Requested medication (s) are on the active medication list: yes ? ?Last refill:  07/08/20 #180 1 RF ? ?Future visit scheduled: yes ? ?Notes to clinic:  blood work needed ? ? ?Requested Prescriptions  ?Pending Prescriptions Disp Refills  ? metFORMIN (GLUCOPHAGE) 1000 MG tablet [Pharmacy Med Name: metFORMIN HCl 1000 MG Oral Tablet] 180 tablet 0  ?  Sig: Take 1 tablet by mouth twice daily  ?  ? Endocrinology:  Diabetes - Biguanides Failed - 05/13/2021  2:45 PM  ?  ?  Failed - Cr in normal range and within 360 days  ?  Creatinine  ?Date Value Ref Range Status  ?04/12/2011 1.33 (H) 0.60 - 1.30 mg/dL Final  ? ?Creatinine, Ser  ?Date Value Ref Range Status  ?06/19/2020 1.32 (H) 0.76 - 1.27 mg/dL Final  ?  ?  ?  ?  Failed - HBA1C is between 0 and 7.9 and within 180 days  ?  Hemoglobin A1C  ?Date Value Ref Range Status  ?05/10/2017 6.9  Final  ? ?Hgb A1c MFr Bld  ?Date Value Ref Range Status  ?02/06/2020 8.8 (H) 4.8 - 5.6 % Final  ?  Comment:  ?           Prediabetes: 5.7 - 6.4 ?         Diabetes: >6.4 ?         Glycemic control for adults with diabetes: <7.0 ?  ?  ?  ?  ?  Failed - B12 Level in normal range and within 720 days  ?  Vitamin B-12  ?Date Value Ref Range Status  ?04/12/2015 418 211 - 946 pg/mL Final  ?  ?  ?  ?  Failed - CBC within normal limits and completed in the last 12 months  ?  WBC  ?Date Value Ref Range Status  ?08/27/2015 15.1 (H) 3.8 - 10.6 K/uL Final  ? ?RBC  ?Date Value Ref Range Status  ?08/27/2015 5.02 4.40 - 5.90 MIL/uL Final  ? ?Hemoglobin  ?Date Value Ref Range Status  ?08/27/2015 15.6 13.0 - 18.0 g/dL Final  ?10/03/2014 15.4 12.6 - 17.7 g/dL Final  ? ?HCT  ?Date Value Ref Range Status  ?08/27/2015 46.2 40.0 - 52.0 % Final  ? ?Hematocrit  ?Date Value Ref Range Status  ?10/03/2014 45.3 37.5 - 51.0 % Final  ? ?MCHC  ?Date Value Ref Range Status  ?08/27/2015 33.8 32.0 - 36.0 g/dL Final  ? ?MCH  ?Date Value Ref Range Status  ?08/27/2015 31.1  26.0 - 34.0 pg Final  ? ?MCV  ?Date Value Ref Range Status  ?08/27/2015 91.9 80.0 - 100.0 fL Final  ?10/03/2014 93 79 - 97 fL Final  ?04/12/2011 95 80 - 100 fL Final  ? ?No results found for: PLTCOUNTKUC, LABPLAT, Nadine ?RDW  ?Date Value Ref Range Status  ?08/27/2015 13.1 11.5 - 14.5 % Final  ?10/03/2014 13.5 12.3 - 15.4 % Final  ?04/12/2011 12.9 11.5 - 14.5 % Final  ? ?  ?  ?  Passed - eGFR in normal range and within 360 days  ?  EGFR (African American)  ?Date Value Ref Range Status  ?04/12/2011 >60 >10m/min Final  ? ?GFR calc Af AWyvonnia Lora ?Date Value Ref Range Status  ?10/23/2019 74 >59 mL/min/1.73 Final  ?  Comment:  ?  **[facility] currently reports eGFR in compliance with the current** ?  recommendations of the [employer] [facility] will ?  update  reporting as new guidelines are published from the NKF-ASN ?  Task force. ?  ? ?EGFR (Non-African Amer.)  ?Date Value Ref Range Status  ?04/12/2011 >60 >24m/min Final  ?  Comment:  ?  eGFR values <640mmin/1.73 m2 may be an indication of chronic ?kidney disease (CKD). ?Calculated eGFR, using the MRDR Study equation, is useful in  ?patients with stable renal function. ?The eGFR calculation will not be reliable in acutely ill patients ?when serum creatinine is changing rapidly. It is not useful in ?patients on dialysis. The eGFR calculation may not be applicable ?to patients at the low and high extremes of body sizes, pregnant ?women, and vegetarians. ?  ? ?GFR calc non Af Amer  ?Date Value Ref Range Status  ?10/23/2019 64 >59 mL/min/1.73 Final  ? ?eGFR  ?Date Value Ref Range Status  ?06/19/2020 62 >59 mL/min/1.73 Final  ?  ?  ?  ?  Passed - Valid encounter within last 6 months  ?  Recent Outpatient Visits   ? ?      ? 5 months ago [facility]  [facility]  ? 10 months [facility]  [facility]  ? 1 year ago Type 2 diabetes mellitus with diabetic  neuropathy, without long-term [place] use of [place] [facility]  ? 1 year ago Need for immunization against influenza  ? [facility] PatchMD  ? 1 year ago [facility] exam  [facility]  ? ?  ?  ?Future Appointments   ? ?        ? In 5 days [facility]? ?  ? ? ?  ?  ?  ? ? ? ? ?

## 2021-05-19 ENCOUNTER — Ambulatory Visit: Payer: No Typology Code available for payment source | Admitting: Family Medicine

## 2021-05-19 ENCOUNTER — Other Ambulatory Visit: Payer: Self-pay | Admitting: Family Medicine

## 2021-05-19 DIAGNOSIS — E1140 Type 2 diabetes mellitus with diabetic neuropathy, unspecified: Secondary | ICD-10-CM

## 2021-06-02 ENCOUNTER — Encounter: Payer: Self-pay | Admitting: Family Medicine

## 2021-06-02 ENCOUNTER — Ambulatory Visit: Payer: No Typology Code available for payment source | Admitting: Family Medicine

## 2021-06-02 ENCOUNTER — Ambulatory Visit (INDEPENDENT_AMBULATORY_CARE_PROVIDER_SITE_OTHER): Payer: No Typology Code available for payment source | Admitting: Family Medicine

## 2021-06-02 VITALS — BP 130/80 | HR 80 | Ht 67.0 in | Wt 181.0 lb

## 2021-06-02 DIAGNOSIS — E782 Mixed hyperlipidemia: Secondary | ICD-10-CM

## 2021-06-02 DIAGNOSIS — N400 Enlarged prostate without lower urinary tract symptoms: Secondary | ICD-10-CM

## 2021-06-02 DIAGNOSIS — I10 Essential (primary) hypertension: Secondary | ICD-10-CM | POA: Diagnosis not present

## 2021-06-02 MED ORDER — LISINOPRIL 20 MG PO TABS: 20.0000 mg | ORAL_TABLET | Freq: Every day | ORAL | 1 refills | Status: DC

## 2021-06-02 MED ORDER — ATORVASTATIN CALCIUM 40 MG PO TABS: 40.0000 mg | ORAL_TABLET | Freq: Every day | ORAL | 1 refills | Status: DC

## 2021-06-02 MED ORDER — TAMSULOSIN HCL 0.4 MG PO CAPS: 0.4000 mg | ORAL_CAPSULE | Freq: Every day | ORAL | 1 refills | Status: DC

## 2021-06-02 MED ORDER — AMLODIPINE BESYLATE 5 MG PO TABS: 5.0000 mg | ORAL_TABLET | Freq: Every day | ORAL | 1 refills | Status: DC

## 2021-06-02 NOTE — Progress Notes (Signed)
Date:  06/02/2021   Name:  Melvin PIETRZAK Sr.   DOB:  [birth year]   MRN:  [id number]   Chief Complaint: Hypertension, Hyperlipidemia, and Benign Prostatic Hypertrophy  Hypertension This is a chronic problem. The current episode started more than 1 year ago. The problem has been gradually improving since onset. The problem is controlled. Pertinent negatives include no anxiety, blurred vision, chest pain, headaches, malaise/fatigue, neck pain, orthopnea, palpitations, peripheral edema, PND, shortness of breath or sweats. There are no associated agents to hypertension. Risk factors for coronary artery disease include diabetes mellitus and dyslipidemia. Past treatments include ACE inhibitors and calcium channel blockers. The current treatment provides moderate improvement. There are no compliance problems.  There is no history of angina, kidney disease, CAD/MI, CVA, heart failure, left ventricular hypertrophy, PVD or retinopathy. There is no history of chronic renal disease, a hypertension causing med or renovascular disease.  Hyperlipidemia This is a chronic problem. The current episode started more than 1 year ago. The problem is controlled. Recent lipid tests were reviewed and are normal. He has no history of chronic renal disease, diabetes, hypothyroidism, liver disease, obesity or nephrotic syndrome. Factors aggravating his hyperlipidemia include thiazides. Pertinent negatives include no chest pain, focal sensory loss, focal weakness, leg pain, myalgias or shortness of breath. Current antihyperlipidemic treatment includes statins. The current treatment provides mild improvement of lipids. There are no compliance problems.   Benign Prostatic Hypertrophy This is a chronic problem. The current episode started more than 1 year ago. The problem has been gradually improving since onset. Irritative symptoms do not include frequency, nocturia or urgency. Obstructive symptoms do not include dribbling,  incomplete emptying, an intermittent stream, a slower stream, straining or a weak stream. Pertinent negatives include no chills, dysuria, hematuria or nausea. Past treatments include tamsulosin. The treatment provided moderate relief.   Lab Results  Component Value Date   NA 138 06/19/2020   K 4.3 06/19/2020   CO2 21 06/19/2020   GLUCOSE 80 06/19/2020   BUN 21 11/22/2020   CREATININE 1.3 11/22/2020   CALCIUM 9.6 06/19/2020   EGFR 62 06/19/2020   GFRNONAA 64 10/23/2019   Lab Results  Component Value Date   CHOL 183 11/22/2020   HDL 56 11/22/2020   LDLCALC 90 11/22/2020   TRIG 184 (A) 11/22/2020   CHOLHDL 4.0 06/10/2018   Lab Results  Component Value Date   TSH 1.40 11/22/2020   Lab Results  Component Value Date   HGBA1C 9.6 11/22/2020   Lab Results  Component Value Date   WBC 15.1 (H) 08/27/2015   HGB 15.6 08/27/2015   HCT 46.2 08/27/2015   MCV 91.9 08/27/2015   PLT 484 (H) 08/27/2015   Lab Results  Component Value Date   ALT 20 11/22/2020   AST 14 11/22/2020   ALKPHOS 70 06/19/2020   BILITOT 0.2 06/19/2020   Lab Results  Component Value Date   VD25OH 29.6 (L) 04/12/2015     Review of Systems  Constitutional:  Negative for chills, fever and malaise/fatigue.  HENT:  Negative for drooling, ear discharge, ear pain and sore throat.   Eyes:  Negative for blurred vision.  Respiratory:  Negative for cough, shortness of breath and wheezing.   Cardiovascular:  Negative for chest pain, palpitations, orthopnea, leg swelling and PND.  Gastrointestinal:  Negative for abdominal pain, blood in stool, constipation, diarrhea and nausea.  Endocrine: Negative for polydipsia.  Genitourinary:  Negative for dysuria, frequency, hematuria, incomplete emptying, nocturia and urgency.  Musculoskeletal:  Negative for back pain, myalgias and neck pain.  Skin:  Negative for rash.  Allergic/Immunologic: Negative for environmental allergies.  Neurological:  Negative for dizziness, focal  weakness and headaches.  Hematological:  Does not bruise/bleed easily.  Psychiatric/Behavioral:  Negative for suicidal ideas. The patient is not nervous/anxious.    Patient Active Problem List   Diagnosis Date Noted   Benign prostatic hyperplasia without lower urinary tract symptoms 06/21/2017   Special screening for malignant neoplasms, colon    Benign neoplasm of transverse colon    Benign neoplasm of ascending colon    Obesity, Class I, BMI 30-34.9 07/12/2015   Overweight 04/12/2015   Left carotid stenosis 04/12/2015   Tinea corporis 10/03/2014   Type 2 diabetes mellitus with diabetic neuropathy, without long-term current use of insulin ([place]) 04/26/2014   HLD (hyperlipidemia) 04/26/2014   HTN (hypertension) 04/26/2014   H/O splenectomy 04/26/2014   Anterior circulation transient ischemic attack 04/26/2014   WPW (Wolff-Parkinson-White syndrome) 07/11/2012    No Known Allergies  Past Surgical History:  Procedure Laterality Date   CARDIAC CATHETERIZATION     over 20 yrs ago.  no issues per pt.   COLONOSCOPY WITH PROPOFOL N/A 02/12/2017   Procedure: COLONOSCOPY WITH PROPOFOL;  Surgeon: Lucilla Lame, MD;  Location: [facility];  Service: Endoscopy;  Laterality: N/A;  Diabetic - oral meds   FOOT SURGERY     HERNIA REPAIR     x2   POLYPECTOMY  02/12/2017   Procedure: POLYPECTOMY;  Surgeon: Lucilla Lame, MD;  Location: [facility];  Service: Endoscopy;;   SPLENECTOMY      Social History   Tobacco Use   Smoking status: Former    Types: Cigarettes    Quit date: 1997    Years since quitting: 26.4   Smokeless tobacco: Never  Vaping Use   Vaping Use: Never used  Substance Use Topics   Alcohol use: No    Alcohol/week: 0.0 standard drinks   Drug use: No     Medication list has been reviewed and updated.  Current Meds  Medication Sig   amLODipine (NORVASC) 5 MG tablet Take 1 tablet (5 mg total) by mouth daily.   aspirin 81 MG tablet Take 81 mg by mouth  daily.   atorvastatin (LIPITOR) 40 MG tablet Take 1 tablet (40 mg total) by mouth daily.   blood glucose meter kit and supplies Dispense based on patient and insurance preference. Use up to four times daily as directed. (FOR ICD-9 250.00, 250.01).   CONTOUR NEXT TEST test strip USE TO CHECK BLOOD SUGAR ONCE DAILY   FARXIGA 10 MG TABS tablet Take 10 mg by mouth daily.   glipiZIDE (GLUCOTROL XL) 10 MG 24 hr tablet Take 1 tablet by mouth once daily with breakfast   lisinopril (ZESTRIL) 20 MG tablet Take 1 tablet (20 mg total) by mouth daily.   metFORMIN (GLUCOPHAGE) 1000 MG tablet Take 1 tablet by mouth twice daily   MICROLET LANCETS MISC USE ONE  TO CHECK GLUCOSE ONCE DAILY   pioglitazone (ACTOS) 45 MG tablet TAKE 1 TABLET BY MOUTH ONCE DAILY. KEEP APPOINTMENT FOR MAY.   tamsulosin (FLOMAX) 0.4 MG CAPS capsule Take 1 capsule (0.4 mg total) by mouth daily.       06/02/2021    9:26 AM 12/02/2020   10:36 AM 06/19/2020   11:12 AM 07/11/2019   10:22 AM  GAD 7 : Generalized Anxiety Score  Nervous, Anxious, on Edge 1 0 0 0  Control/stop worrying 0 0 0 0  Worry too much - different things 0 0 0 0  Trouble relaxing 0 0 0 0  Restless 3 0 0 0  Easily annoyed or irritable 3 1 0 0  Afraid - awful might happen 0 0 0 0  Total GAD 7 Score 7 1 0 0  Anxiety Difficulty Not difficult at all Not difficult at all         06/02/2021    9:25 AM  Depression screen PHQ 2/9  Decreased Interest 0  Down, Depressed, Hopeless 0  PHQ - 2 Score 0  Altered sleeping 2  Tired, decreased energy 2  Change in appetite 3  Feeling bad or failure about yourself  0  Trouble concentrating 0  Moving slowly or fidgety/restless 0  Suicidal thoughts 0  PHQ-9 Score 7  Difficult doing work/chores Not difficult at all    BP Readings from Last 3 Encounters:  06/02/21 130/80  12/02/20 (!) 144/78  06/19/20 138/80    Physical Exam Vitals and nursing note reviewed.  HENT:     Head: Normocephalic.     Right Ear: Tympanic  membrane, ear canal and external ear normal. There is no impacted cerumen.     Left Ear: Tympanic membrane, ear canal and external ear normal. There is no impacted cerumen.     Nose: Nose normal. No congestion or rhinorrhea.     Mouth/Throat:     Mouth: Mucous membranes are moist.  Eyes:     General: No scleral icterus.       Right eye: No discharge.        Left eye: No discharge.     Conjunctiva/sclera: Conjunctivae normal.     Pupils: Pupils are equal, round, and reactive to light.  Neck:     Thyroid: No thyromegaly.     Vascular: No JVD.     Trachea: No tracheal deviation.  Cardiovascular:     Rate and Rhythm: Normal rate and regular rhythm.     Heart sounds: Normal heart sounds. No murmur heard.   No friction rub. No gallop.  Pulmonary:     Effort: No respiratory distress.     Breath sounds: Normal breath sounds. No wheezing, rhonchi or rales.  Chest:     Chest wall: No tenderness.  Abdominal:     General: Bowel sounds are normal.     Palpations: Abdomen is soft. There is no mass.     Tenderness: There is no abdominal tenderness. There is no guarding or rebound.  Musculoskeletal:        General: No tenderness. Normal range of motion.     Cervical back: Normal range of motion and neck supple.  Lymphadenopathy:     Cervical: No cervical adenopathy.  Skin:    General: Skin is warm.     Findings: No rash.  Neurological:     Mental Status: He is alert and oriented to person, place, and time.     Cranial Nerves: No cranial nerve deficit.     Deep Tendon Reflexes: Reflexes are normal and symmetric.    Wt Readings from Last 3 Encounters:  06/02/21 181 lb (82.1 kg)  12/02/20 183 lb 9.6 oz (83.3 kg)  06/19/20 183 lb (83 kg)    BP 130/80   Pulse 80   Ht _0  (1.702 m)   Wt 181 lb (82.1 kg)   BMI 28.35 kg/m   Assessment and Plan: 1. Essential hypertension Chronic.  Controlled.  Stable.  Blood pressure today 130/80.  Continue amlodipine 5 mg and lisinopril 20 mg once  a day.  Review of previous labs in November 2022 are acceptable we will not do lab work today.  Patient has been encouraged to return to endocrine for the appointment that he missed and may - amLODipine (NORVASC) 5 MG tablet; Take 1 tablet (5 mg total) by mouth daily.  Dispense: 90 tablet; Refill: 1 - lisinopril (ZESTRIL) 20 MG tablet; Take 1 tablet (20 mg total) by mouth daily.  Dispense: 90 tablet; Refill: 1  2. Mixed hyperlipidemia Chronic.  Controlled.  Stable.  Review of previous lipid panel is acceptable we will continue atorvastatin 40 mg once a day. - atorvastatin (LIPITOR) 40 MG tablet; Take 1 tablet (40 mg total) by mouth daily.  Dispense: 90 tablet; Refill: 1  3. Benign prostatic hyperplasia without lower urinary tract symptoms Chronic.  Controlled.  Asymptomatic.  Patient will continue on Flomax 0 point is once a day.  We will recheck in 6 months - tamsulosin (FLOMAX) 0.4 MG CAPS capsule; Take 1 capsule (0.4 mg total) by mouth daily.  Dispense: 90 capsule; Refill: 1

## 2021-06-10 ENCOUNTER — Other Ambulatory Visit: Payer: Self-pay | Admitting: Family Medicine

## 2021-07-16 ENCOUNTER — Other Ambulatory Visit: Payer: Self-pay | Admitting: Family Medicine

## 2021-07-16 DIAGNOSIS — E1140 Type 2 diabetes mellitus with diabetic neuropathy, unspecified: Secondary | ICD-10-CM

## 2021-07-16 NOTE — Telephone Encounter (Signed)
Requested medication (s) are due for refill today: yes  Requested medication (s) are on the active medication list: yes  Last refill:  03/11/21 #90 0 refills   Future visit scheduled: yes in 4 months   Notes to clinic:  protocol failed. Last labs 11/22/20. Do you want to refill Rx?     Requested Prescriptions  Pending Prescriptions Disp Refills   glipiZIDE (GLUCOTROL XL) 10 MG 24 hr tablet [Pharmacy Med Name: glipiZIDE ER 10 MG Oral Tablet Extended Release 24 Hour] 90 tablet 0    Sig: Take 1 tablet by mouth once daily with breakfast     Endocrinology:  Diabetes - Sulfonylureas Failed - 07/16/2021  8:16 AM      Failed - HBA1C is between 0 and 7.9 and within 180 days    Hemoglobin A1C  Date Value Ref Range Status  11/22/2020 9.6  Final         Passed - Cr in normal range and within 360 days    Creatinine  Date Value Ref Range Status  11/22/2020 1.3 0.6 - 1.3 Final  04/12/2011 1.33 (H) 0.60 - 1.30 mg/dL Final   Creatinine, Ser  Date Value Ref Range Status  06/19/2020 1.32 (H) 0.76 - 1.27 mg/dL Final         Passed - Valid encounter within last 6 months    Recent Outpatient Visits           1 month ago Essential hypertension   [place], Deanna C, MD   7 months ago Need for immunization against influenza   [place], Deanna C, MD   1 year ago Essential hypertension   [place], Deanna C, MD   1 year ago Type 2 diabetes mellitus with diabetic neuropathy, without long-term current use of insulin ([place])   [facility] Juline Patch, MD   1 year ago Need for immunization against influenza   [place], Deanna C, MD       Future Appointments             In 4 months Juline Patch, MD [facility], [place]   In 4 months Juline Patch, MD [facility], [facility]

## 2021-07-21 ENCOUNTER — Other Ambulatory Visit: Payer: Self-pay | Admitting: Family Medicine

## 2021-07-21 DIAGNOSIS — E1140 Type 2 diabetes mellitus with diabetic neuropathy, unspecified: Secondary | ICD-10-CM

## 2021-07-22 NOTE — Telephone Encounter (Signed)
last RF 07/16/21 #30  Requested Prescriptions  Refused Prescriptions Disp Refills  . glipiZIDE (GLUCOTROL XL) 10 MG 24 hr tablet [Pharmacy Med Name: glipiZIDE ER 10 MG Oral Tablet Extended Release 24 Hour] 90 tablet 0    Sig: Take 1 tablet by mouth once daily with breakfast     Endocrinology:  Diabetes - Sulfonylureas Failed - 07/21/2021  5:56 PM      Failed - HBA1C is between 0 and 7.9 and within 180 days    Hemoglobin A1C  Date Value Ref Range Status  11/22/2020 9.6  Final         Passed - Cr in normal range and within 360 days    Creatinine  Date Value Ref Range Status  11/22/2020 1.3 0.6 - 1.3 Final  04/12/2011 1.33 (H) 0.60 - 1.30 mg/dL Final   Creatinine, Ser  Date Value Ref Range Status  06/19/2020 1.32 (H) 0.76 - 1.27 mg/dL Final         Passed - Valid encounter within last 6 months    Recent Outpatient Visits          1 month ago Essential hypertension   [place], Deanna C, MD   7 months ago Need for immunization against influenza   [place], Deanna C, MD   1 year ago Essential hypertension   [place], Deanna C, MD   1 year ago Type 2 diabetes mellitus with diabetic neuropathy, without long-term current use of insulin ([place])   [facility] Juline Patch, MD   1 year ago Need for immunization against influenza   [place], Deanna C, MD      Future Appointments            In 4 months Juline Patch, MD [facility], [place]   In 4 months Juline Patch, MD [facility], [facility]

## 2021-09-08 ENCOUNTER — Other Ambulatory Visit: Payer: Self-pay | Admitting: Family Medicine

## 2021-09-08 DIAGNOSIS — E1140 Type 2 diabetes mellitus with diabetic neuropathy, unspecified: Secondary | ICD-10-CM

## 2021-09-21 ENCOUNTER — Other Ambulatory Visit: Payer: Self-pay | Admitting: Family Medicine

## 2021-09-21 DIAGNOSIS — E1140 Type 2 diabetes mellitus with diabetic neuropathy, unspecified: Secondary | ICD-10-CM

## 2021-10-03 ENCOUNTER — Other Ambulatory Visit: Payer: Self-pay | Admitting: Family Medicine

## 2021-10-03 DIAGNOSIS — E1140 Type 2 diabetes mellitus with diabetic neuropathy, unspecified: Secondary | ICD-10-CM

## 2021-10-03 NOTE — Telephone Encounter (Signed)
Requested medication (s) are due for refill today:   Yes  Requested medication (s) are on the active medication list:   Yes  Future visit scheduled:   Yes   Last ordered: 05/19/2021 #180, 0 refills  Returned because labs are due per protocol.   Has an upcoming appt.     Requested Prescriptions  Pending Prescriptions Disp Refills   metFORMIN (GLUCOPHAGE) 1000 MG tablet [Pharmacy Med Name: metFORMIN HCl 1000 MG Oral Tablet] 180 tablet 0    Sig: TAKE 1 TABLET BY MOUTH TWICE DAILY -  [place]     Endocrinology:  Diabetes - Biguanides Failed - 10/03/2021  8:23 AM      Failed - HBA1C is between 0 and 7.9 and within 180 days    Hemoglobin A1C  Date Value Ref Range Status  11/22/2020 9.6  Final         Failed - eGFR in normal range and within 360 days    EGFR (African American)  Date Value Ref Range Status  04/12/2011 >60 >45m/min Final   GFR calc Af Amer  Date Value Ref Range Status  10/23/2019 74 >59 mL/min/1.73 Final    Comment:    **[facility] currently reports eGFR in compliance with the current**   recommendations of the [employer] [facility] will   update reporting as new guidelines are published from the NKF-ASN   Task force.    EGFR (Non-African Amer.)  Date Value Ref Range Status  04/12/2011 >60 >658mmin Final    Comment:    eGFR values <6049min/1.73 m2 may be an indication of chronic kidney disease (CKD). Calculated eGFR, using the MRDR Study equation, is useful in  patients with stable renal function. The eGFR calculation will not be reliable in acutely ill patients when serum creatinine is changing rapidly. It is not useful in patients on dialysis. The eGFR calculation may not be applicable to patients at the low and high extremes of body sizes, pregnant women, and vegetarians.    GFR calc non Af Amer  Date Value Ref Range Status  10/23/2019 64 >59 mL/min/1.73 Final   eGFR  Date Value Ref Range Status  06/19/2020 62 >59 mL/min/1.73  Final         Failed - B12 Level in normal range and within 720 days    Vitamin B-12  Date Value Ref Range Status  04/12/2015 418 211 - 946 pg/mL Final         Failed - CBC within normal limits and completed in the last 12 months    WBC  Date Value Ref Range Status  08/27/2015 15.1 (H) 3.8 - 10.6 K/uL Final   RBC  Date Value Ref Range Status  08/27/2015 5.02 4.40 - 5.90 MIL/uL Final   Hemoglobin  Date Value Ref Range Status  08/27/2015 15.6 13.0 - 18.0 g/dL Final  10/03/2014 15.4 12.6 - 17.7 g/dL Final   HCT  Date Value Ref Range Status  08/27/2015 46.2 40.0 - 52.0 % Final   Hematocrit  Date Value Ref Range Status  10/03/2014 45.3 37.5 - 51.0 % Final   MCHC  Date Value Ref Range Status  08/27/2015 33.8 32.0 - 36.0 g/dL Final   [facility] Value Ref Range Status  08/27/2015 31.1 26.0 - 34.0 pg Final   MCV  Date Value Ref Range Status  08/27/2015 91.9 80.0 - 100.0 fL Final  10/03/2014 93 79 - 97 fL Final  04/12/2011 95 80 - 100 fL Final  No results found for: "PLTCOUNTKUC", "LABPLAT", "POCPLA" RDW  Date Value Ref Range Status  08/27/2015 13.1 11.5 - 14.5 % Final  10/03/2014 13.5 12.3 - 15.4 % Final  04/12/2011 12.9 11.5 - 14.5 % Final         Passed - Cr in normal range and within 360 days    Creatinine  Date Value Ref Range Status  11/22/2020 1.3 0.6 - 1.3 Final  04/12/2011 1.33 (H) 0.60 - 1.30 mg/dL Final   Creatinine, Ser  Date Value Ref Range Status  06/19/2020 1.32 (H) 0.76 - 1.27 mg/dL Final         Passed - Valid encounter within last 6 months    Recent Outpatient Visits           4 months ago Essential hypertension   [facility] [place], Deanna C, MD   10 months ago Need for immunization against influenza   [facility] [place], Deanna C, MD   1 year ago Essential hypertension   [facility] [place], [place], MD   1 year ago Type 2 diabetes mellitus with diabetic neuropathy, without long-term current use of insulin ([place])   [facility] [place], Deanna C, MD   1 year ago Need for immunization against influenza   [facility] [place], [place], MD       Future Appointments             In 2 months Juline Patch, MD [facility] at [facility], [place]   In 2 months Juline Patch, MD [facility] at [facility], [facility]

## 2021-10-06 IMAGING — CT CT CHEST W/O CM
2 of 4 series · 15 of 36 positions shown, 18 images · non-contrast
Comparison: 03/28/2019

CLINICAL DATA: COVID pneumonia March 2019, right-sided chest pain,
cough, shortness of breath, fatigue, remote history of smoking

EXAM:
CT CHEST WITHOUT CONTRAST
TECHNIQUE: Multidetector CT imaging of the chest was performed following the
standard protocol without IV contrast.

[Series 2: chest 2.00 · axial · 0.69mm/px · z∈[-1219,-953]mm · 12 of 159 slices shown, 15 images]
[im 13/159  mediastinal]
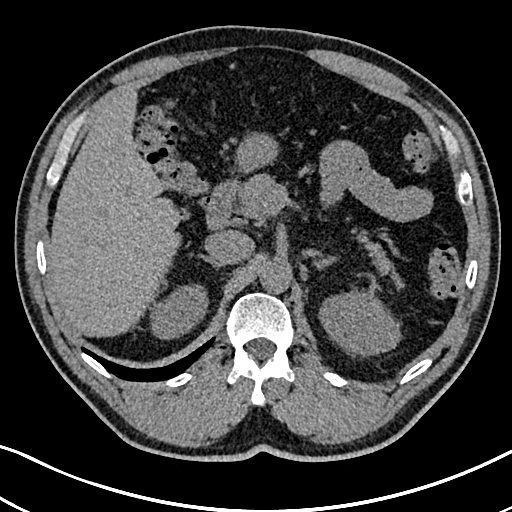
[im 13/159  lung]
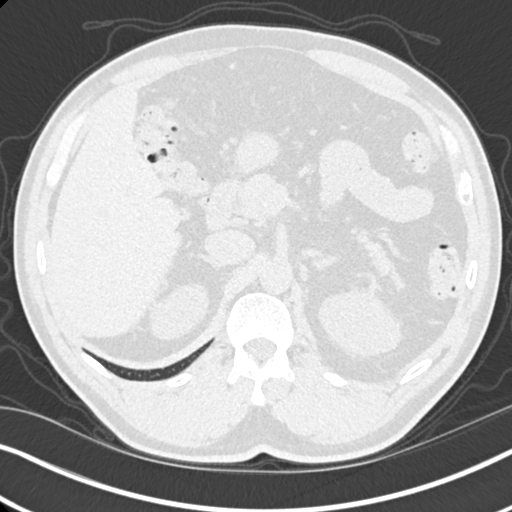
[im 25/159  lung]
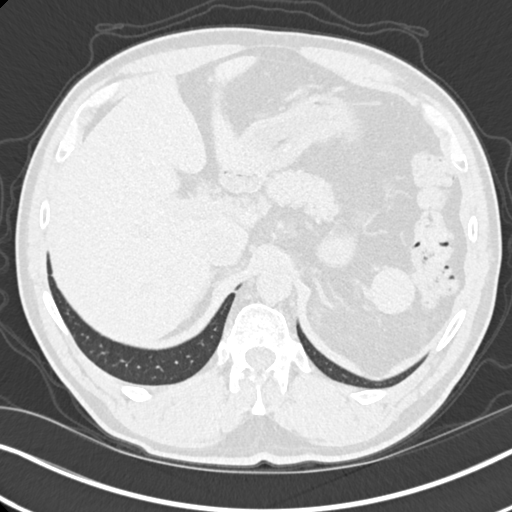
[im 37/159  lung]
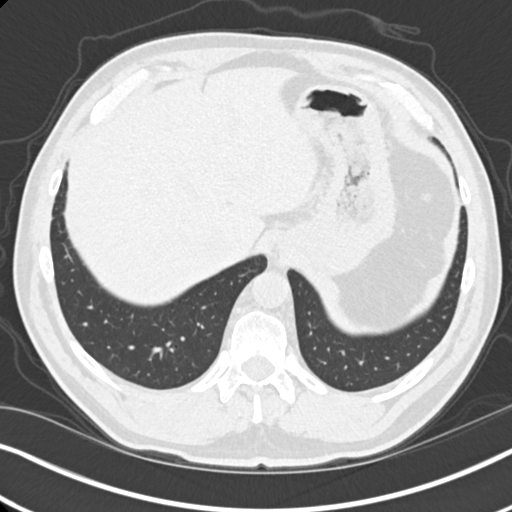
[im 49/159  lung]
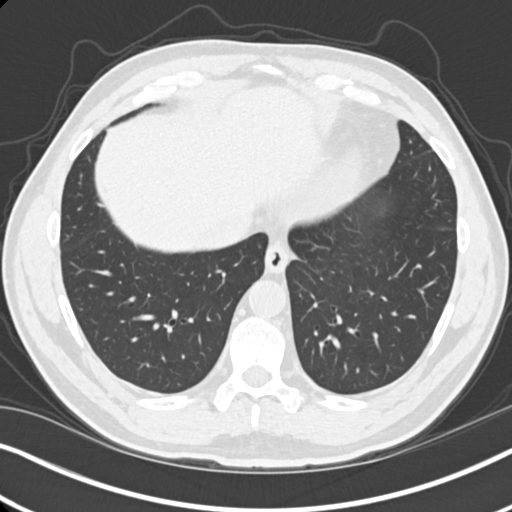
[im 61/159  mediastinal]
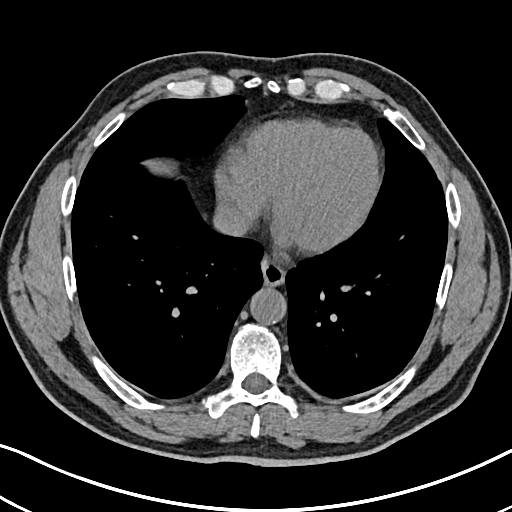
[im 61/159  lung]
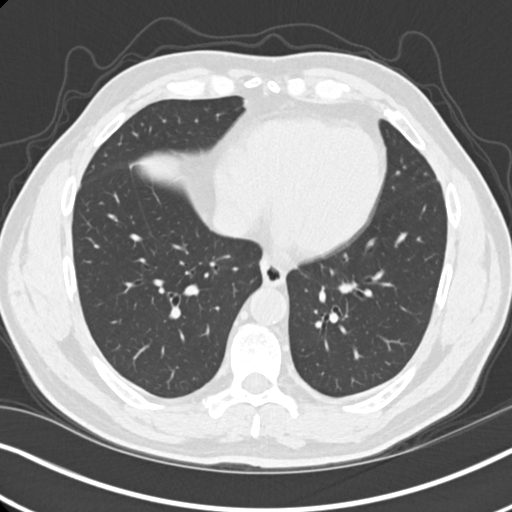
[im 73/159  lung]
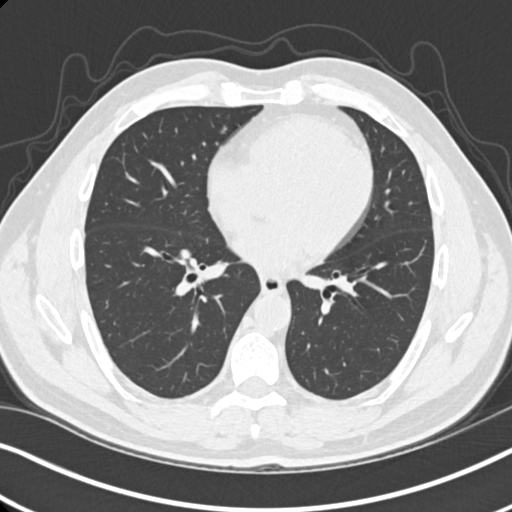
[im 86/159  lung]
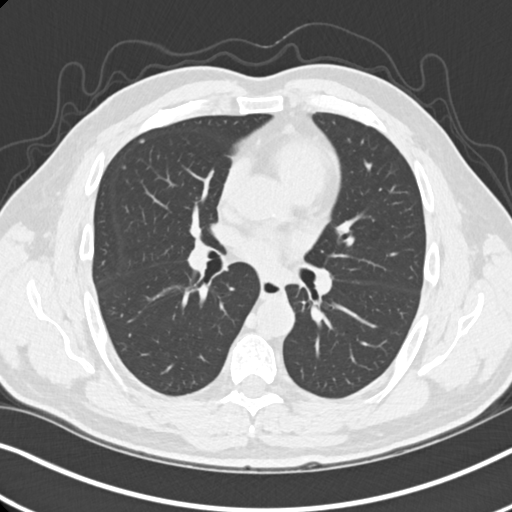
[im 98/159  lung]
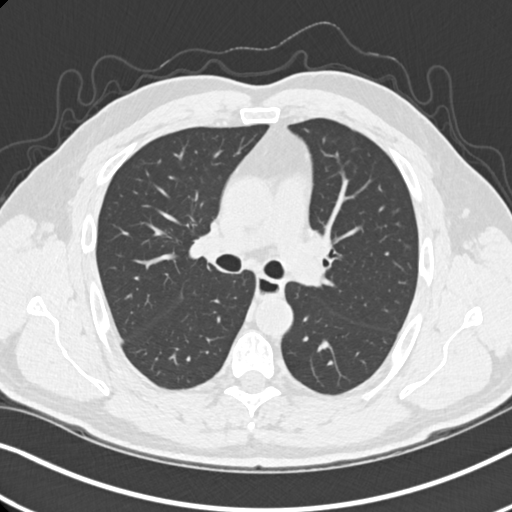
[im 110/159  mediastinal]
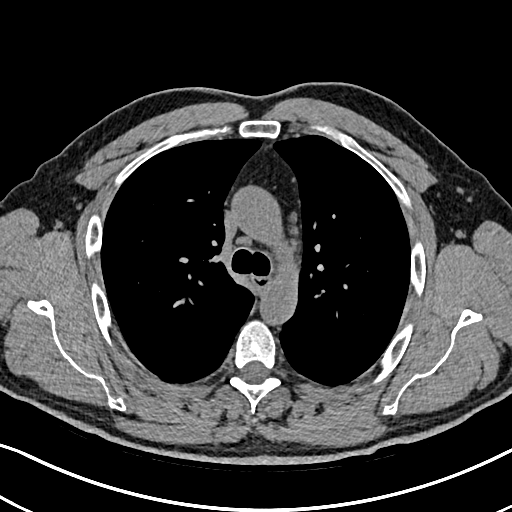
[im 110/159  lung]
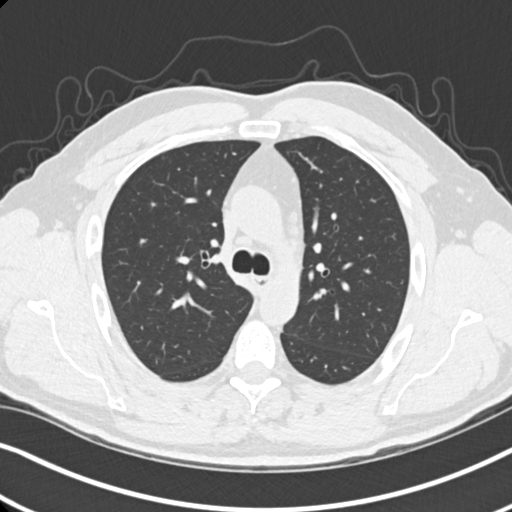
[im 122/159  lung]
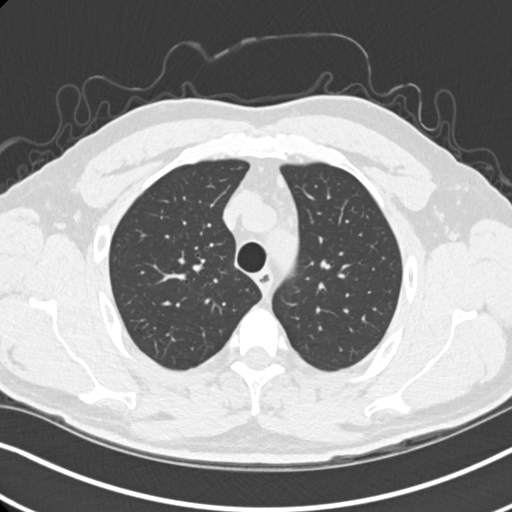
[im 134/159  lung]
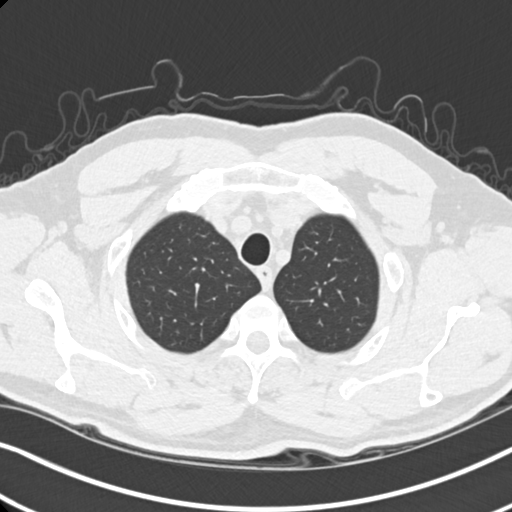
[im 146/159  lung]
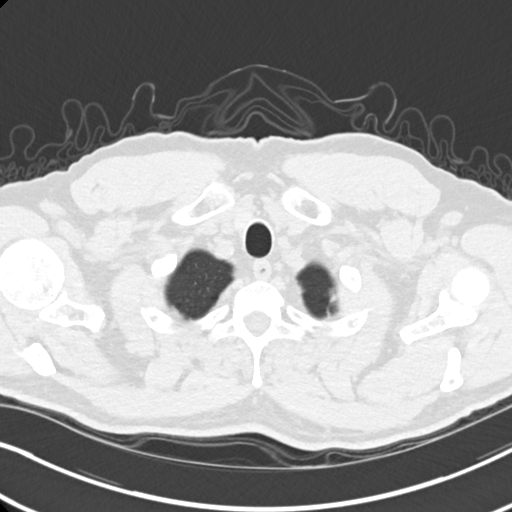

[Series 5: coronals chest 2.00 cor · coronal · 0.62mm/px · 3 of 145 slices shown]
[im 29/145  lung]
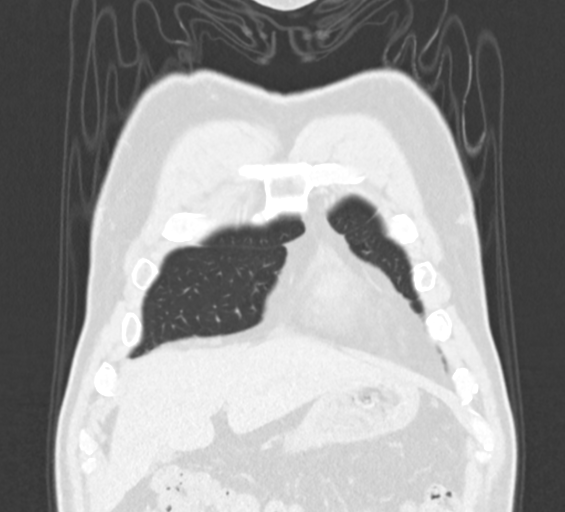
[im 58/145  lung]
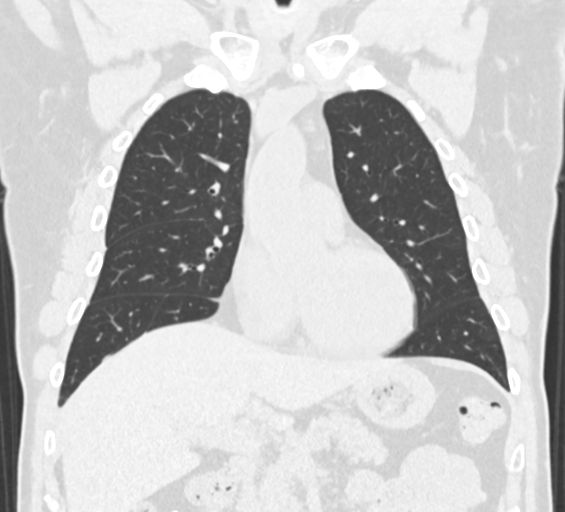
[im 87/145  lung]
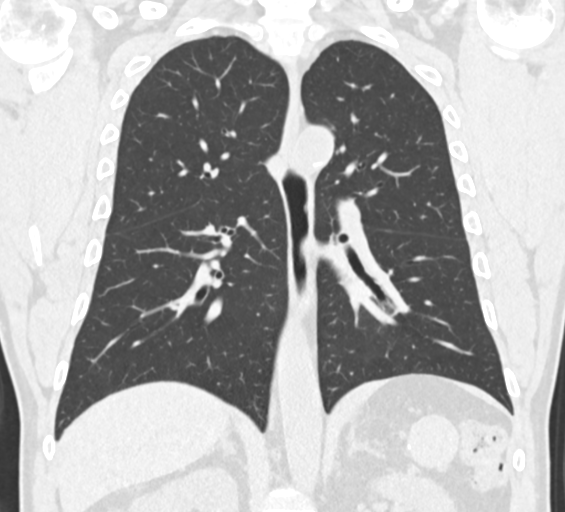

[15 of 36 positions shown; findings below may reference images not displayed]

FINDINGS: Cardiovascular: Scattered aortic atherosclerosis. Normal heart size.
No pericardial effusion.

Mediastinum/Nodes: No enlarged mediastinal, hilar, or axillary lymph
nodes. Thyroid gland, trachea, and esophagus demonstrate no
significant findings.

Lungs/Pleura: Interval resolution of previously seen ground-glass
airspace opacity throughout the lungs. Tiny fissural nodules of the
right middle lobe are stable and definitively benign (series 3,
image 74). There is a background of very tiny centrilobular nodules
in the lung apices. No pleural effusion or pneumothorax.

Upper Abdomen: No acute abnormality. Hypertrophic splenule adjacent
to the pancreatic tail, status post splenectomy.

Musculoskeletal: No chest wall mass or suspicious bone lesions
identified.
IMPRESSION: 1. Interval resolution of previously seen ground-glass airspace
opacity throughout the lungs, consistent with resolution of acute
COVID airspace disease at that time.
2. No evidence of post infectious/inflammatory fibrosis.
3. Background of very tiny centrilobular nodules in the lung apices,
most commonly seen in smoking-related respiratory bronchiolitis,
although other etiologies should be considered, such as
hypersensitivity pneumonitis, given report of very remote smoking
history greater than 30 years ago.

Aortic Atherosclerosis (D104J-BF9.9).

## 2021-12-03 ENCOUNTER — Ambulatory Visit: Payer: No Typology Code available for payment source | Admitting: Family Medicine

## 2021-12-19 LAB — HM DIABETES FOOT EXAM: HM Diabetic Foot Exam: NORMAL

## 2021-12-19 LAB — MICROALBUMIN / CREATININE URINE RATIO: Microalb Creat Ratio: <19.1

## 2021-12-19 LAB — HEMOGLOBIN A1C: Hemoglobin A1C: 7.8

## 2021-12-19 LAB — MICROALBUMIN, URINE: Microalb, Ur: 36.6

## 2021-12-22 ENCOUNTER — Encounter: Payer: Self-pay | Admitting: Family Medicine

## 2021-12-22 ENCOUNTER — Ambulatory Visit (INDEPENDENT_AMBULATORY_CARE_PROVIDER_SITE_OTHER): Payer: No Typology Code available for payment source | Admitting: Family Medicine

## 2021-12-22 VITALS — BP 118/76 | HR 80 | Ht 67.0 in | Wt 181.0 lb

## 2021-12-22 DIAGNOSIS — Z23 Encounter for immunization: Secondary | ICD-10-CM

## 2021-12-22 DIAGNOSIS — E782 Mixed hyperlipidemia: Secondary | ICD-10-CM

## 2021-12-22 DIAGNOSIS — N400 Enlarged prostate without lower urinary tract symptoms: Secondary | ICD-10-CM

## 2021-12-22 DIAGNOSIS — I10 Essential (primary) hypertension: Secondary | ICD-10-CM | POA: Diagnosis not present

## 2021-12-22 MED ORDER — TAMSULOSIN HCL 0.4 MG PO CAPS: 0.4000 mg | ORAL_CAPSULE | Freq: Every day | ORAL | 1 refills | Status: DC

## 2021-12-22 MED ORDER — ATORVASTATIN CALCIUM 40 MG PO TABS: 40.0000 mg | ORAL_TABLET | Freq: Every day | ORAL | 1 refills | Status: DC

## 2021-12-22 MED ORDER — LISINOPRIL 20 MG PO TABS: 20.0000 mg | ORAL_TABLET | Freq: Every day | ORAL | 1 refills | Status: DC

## 2021-12-22 MED ORDER — AMLODIPINE BESYLATE 5 MG PO TABS: 5.0000 mg | ORAL_TABLET | Freq: Every day | ORAL | 1 refills | Status: DC

## 2021-12-22 NOTE — Patient Instructions (Signed)

## 2021-12-22 NOTE — Progress Notes (Signed)
Date:  12/22/2021   Name:  Melvin STEERS Sr.   DOB:  15-Sep-1960   MRN:  [id number]   Chief Complaint: Hypertension, Hyperlipidemia, Benign Prostatic Hypertrophy, and Flu Vaccine  Hypertension This is a chronic problem. The current episode started more than 1 year ago. The problem has been gradually improving since onset. The problem is controlled. Pertinent negatives include no blurred vision, chest pain, headaches, neck pain, orthopnea, palpitations, PND or shortness of breath. There are no associated agents to hypertension. There are no known risk factors for coronary artery disease. Past treatments include calcium channel blockers and ACE inhibitors. There are no compliance problems.  There is no history of angina, kidney disease, CAD/MI, CVA, heart failure, left ventricular hypertrophy, PVD or retinopathy. There is no history of chronic renal disease, a hypertension causing med or renovascular disease.  Hyperlipidemia This is a chronic problem. The current episode started more than 1 year ago. The problem is controlled. Recent lipid tests were reviewed and are normal. Exacerbating diseases include diabetes. He has no history of chronic renal disease. Pertinent negatives include no chest pain, myalgias or shortness of breath. Current antihyperlipidemic treatment includes diet change and statins. The current treatment provides moderate improvement of lipids. Risk factors for coronary artery disease include dyslipidemia, diabetes mellitus and hypertension.  Benign Prostatic Hypertrophy This is a chronic problem. The current episode started more than 1 year ago. Irritative symptoms include nocturia. Irritative symptoms do not include frequency or urgency. Obstructive symptoms include dribbling. Obstructive symptoms do not include incomplete emptying, an intermittent stream, a slower stream, straining or a weak stream. Pertinent negatives include no chills, dysuria, hematuria or nausea. Past  treatments include tamsulosin. The treatment provided moderate relief.    Lab Results  Component Value Date   NA 138 06/19/2020   K 4.3 06/19/2020   CO2 21 06/19/2020   GLUCOSE 80 06/19/2020   BUN 21 11/22/2020   CREATININE 1.3 11/22/2020   CALCIUM 9.6 06/19/2020   EGFR 62 06/19/2020   GFRNONAA 64 10/23/2019   Lab Results  Component Value Date   CHOL 183 11/22/2020   HDL 56 11/22/2020   LDLCALC 90 11/22/2020   TRIG 184 (A) 11/22/2020   CHOLHDL 4.0 06/10/2018   Lab Results  Component Value Date   TSH 1.40 11/22/2020   Lab Results  Component Value Date   HGBA1C 7.8 12/19/2021   Lab Results  Component Value Date   WBC 15.1 (H) 08/27/2015   HGB 15.6 08/27/2015   HCT 46.2 08/27/2015   MCV 91.9 08/27/2015   PLT 484 (H) 08/27/2015   Lab Results  Component Value Date   ALT 20 11/22/2020   AST 14 11/22/2020   ALKPHOS 70 06/19/2020   BILITOT 0.2 06/19/2020   Lab Results  Component Value Date   VD25OH 29.6 (L) 04/12/2015     Review of Systems  Constitutional:  Negative for chills, diaphoresis and fever.  HENT:  Negative for drooling, ear discharge, ear pain and sore throat.   Eyes:  Negative for blurred vision.  Respiratory:  Negative for cough, chest tightness, shortness of breath and wheezing.   Cardiovascular:  Negative for chest pain, palpitations, orthopnea, leg swelling and PND.  Gastrointestinal:  Negative for abdominal pain, blood in stool, constipation, diarrhea and nausea.  Endocrine: Negative for polydipsia.  Genitourinary:  Positive for nocturia. Negative for dysuria, frequency, hematuria, incomplete emptying and urgency.  Musculoskeletal:  Negative for back pain, myalgias and neck pain.  Skin:  Negative  for rash.  Allergic/Immunologic: Negative for environmental allergies.  Neurological:  Positive for light-headedness. Negative for dizziness and headaches.  Hematological:  Does not bruise/bleed easily.  Psychiatric/Behavioral:  Negative for suicidal  ideas. The patient is not nervous/anxious.     Patient Active Problem List   Diagnosis Date Noted   Benign prostatic hyperplasia without lower urinary tract symptoms 06/21/2017   Special screening for malignant neoplasms, colon    Benign neoplasm of transverse colon    Benign neoplasm of ascending colon    Obesity, Class I, BMI 30-34.9 07/12/2015   Overweight 04/12/2015   Left carotid stenosis 04/12/2015   Tinea corporis 10/03/2014   Type 2 diabetes mellitus with diabetic neuropathy, without long-term current use of insulin ([place]) 04/26/2014   HLD (hyperlipidemia) 04/26/2014   HTN (hypertension) 04/26/2014   H/O splenectomy 04/26/2014   Anterior circulation transient ischemic attack 04/26/2014   WPW (Wolff-Parkinson-White syndrome) 07/11/2012    No Known Allergies  Past Surgical History:  Procedure Laterality Date   CARDIAC CATHETERIZATION     over 20 yrs ago.  no issues per pt.   COLONOSCOPY WITH PROPOFOL N/A 02/12/2017   Procedure: COLONOSCOPY WITH PROPOFOL;  Surgeon: Lucilla Lame, MD;  Location: [facility];  Service: Endoscopy;  Laterality: N/A;  Diabetic - oral meds   FOOT SURGERY     HERNIA REPAIR     x2   POLYPECTOMY  02/12/2017   Procedure: POLYPECTOMY;  Surgeon: Lucilla Lame, MD;  Location: [facility];  Service: Endoscopy;;   SPLENECTOMY      Social History   Tobacco Use   Smoking status: Former    Types: Cigarettes    Quit date: 1997    Years since quitting: 26.9   Smokeless tobacco: Never  Vaping Use   Vaping Use: Never used  Substance Use Topics   Alcohol use: No    Alcohol/week: 0.0 standard drinks of alcohol   Drug use: No     Medication list has been reviewed and updated.  Current Meds  Medication Sig   amLODipine (NORVASC) 5 MG tablet Take 1 tablet (5 mg total) by mouth daily.   aspirin 81 MG tablet Take 81 mg by mouth daily.   atorvastatin (LIPITOR) 40 MG tablet Take 1 tablet (40 mg total) by mouth daily.   blood glucose meter  kit and supplies Dispense based on patient and insurance preference. Use up to four times daily as directed. (FOR ICD-9 250.00, 250.01).   CONTOUR NEXT TEST test strip USE TO CHECK BLOOD SUGAR ONCE DAILY   FARXIGA 10 MG TABS tablet Take 10 mg by mouth daily.   glipiZIDE (GLUCOTROL XL) 10 MG 24 hr tablet Take 1 tablet by mouth once daily with breakfast (Patient taking differently: 20 mg daily with breakfast. 2 tablets daily)   lisinopril (ZESTRIL) 20 MG tablet Take 1 tablet (20 mg total) by mouth daily.   metFORMIN (GLUCOPHAGE) 1000 MG tablet Take 1 tablet by mouth twice daily   MICROLET LANCETS MISC USE ONE  TO CHECK GLUCOSE ONCE DAILY   pioglitazone (ACTOS) 45 MG tablet TAKE 1 TABLET BY MOUTH ONCE DAILY. KEEP APPOINTMENT FOR MAY.   tamsulosin (FLOMAX) 0.4 MG CAPS capsule Take 1 capsule (0.4 mg total) by mouth daily.       12/22/2021    8:24 AM 06/02/2021    9:26 AM 12/02/2020   10:36 AM 06/19/2020   11:12 AM  GAD 7 : Generalized Anxiety Score  Nervous, Anxious, on Edge 0 1 0  0  Control/stop worrying 0 0 0 0  Worry too much - different things 0 0 0 0  Trouble relaxing 0 0 0 0  Restless 0 3 0 0  Easily annoyed or irritable 0 3 1 0  Afraid - awful might happen 0 0 0 0  Total GAD 7 Score 0 7 1 0  Anxiety Difficulty Not difficult at all Not difficult at all Not difficult at all        12/22/2021    8:24 AM 06/02/2021    9:25 AM 12/02/2020   10:36 AM  Depression screen PHQ 2/9  Decreased Interest 0 0 0  Down, Depressed, Hopeless 0 0 0  PHQ - 2 Score 0 0 0  Altered sleeping 0 2 0  Tired, decreased energy 0 2 0  Change in appetite 0 3 0  Feeling bad or failure about yourself  0 0 0  Trouble concentrating 0 0 0  Moving slowly or fidgety/restless 0 0 0  Suicidal thoughts 0 0 0  PHQ-9 Score 0 7 0  Difficult doing work/chores Not difficult at all Not difficult at all Not difficult at all    BP Readings from Last 3 Encounters:  12/22/21 118/76  06/02/21 130/80  12/02/20 (!) 144/78     Physical Exam Vitals and nursing note reviewed.  HENT:     Head: Normocephalic.     Right Ear: Tympanic membrane and external ear normal.     Left Ear: Tympanic membrane and external ear normal.     Nose: Nose normal.     Mouth/Throat:     Mouth: Mucous membranes are moist.  Eyes:     General: No scleral icterus.       Right eye: No discharge.        Left eye: No discharge.     Conjunctiva/sclera: Conjunctivae normal.     Pupils: Pupils are equal, round, and reactive to light.  Neck:     Thyroid: No thyromegaly.     Vascular: No JVD.     Trachea: No tracheal deviation.  Cardiovascular:     Rate and Rhythm: Normal rate and regular rhythm.     Heart sounds: Normal heart sounds. No murmur heard.    No friction rub. No gallop.  Pulmonary:     Effort: No respiratory distress.     Breath sounds: Normal breath sounds. No wheezing, rhonchi or rales.  Abdominal:     General: Bowel sounds are normal.     Palpations: Abdomen is soft. There is no mass.     Tenderness: There is no abdominal tenderness. There is no guarding or rebound.  Musculoskeletal:        General: No tenderness. Normal range of motion.     Cervical back: Normal range of motion and neck supple.  Lymphadenopathy:     Cervical: No cervical adenopathy.  Skin:    General: Skin is warm.     Findings: No rash.  Neurological:     Mental Status: He is alert and oriented to person, place, and time.     Cranial Nerves: No cranial nerve deficit.     Deep Tendon Reflexes: Reflexes are normal and symmetric.     Wt Readings from Last 3 Encounters:  12/22/21 181 lb (82.1 kg)  06/02/21 181 lb (82.1 kg)  12/02/20 183 lb 9.6 oz (83.3 kg)    BP 118/76   Pulse 80   Ht 5' 7" (1.702 m)   Wt 181 lb (82.1  kg)   SpO2 99%   BMI 28.35 kg/m   Assessment and Plan:  1. Essential hypertension Chronic.  Controlled.  Stable.  Blood pressure 118/76.  Asymptomatic.  Tolerating medication well.  Continue amlodipine 5 mg once a  day and lisinopril 20 mg once a day.  Will recheck in 6 months. - amLODipine (NORVASC) 5 MG tablet; Take 1 tablet (5 mg total) by mouth daily.  Dispense: 90 tablet; Refill: 1 - lisinopril (ZESTRIL) 20 MG tablet; Take 1 tablet (20 mg total) by mouth daily.  Dispense: 90 tablet; Refill: 1  2. Mixed hyperlipidemia .  Controlled.  Stable.  Continue atorvastatin 40 mg once a day. - atorvastatin (LIPITOR) 40 MG tablet; Take 1 tablet (40 mg total) by mouth daily.  Dispense: 90 tablet; Refill: 1  3. Benign prostatic hyperplasia without lower urinary tract symptoms .  Controlled.  Stable.  Other than nocturia 2-3 times a night patient is doing well on tamsulosin 0.4 mg once a day. - tamsulosin (FLOMAX) 0.4 MG CAPS capsule; Take 1 capsule (0.4 mg total) by mouth daily.  Dispense: 90 capsule; Refill: 1  4. Need for immunization against influenza Discussed and administered. - Flu Vaccine QUAD 15moIM (Fluarix, Fluzone & Alfiuria Quad PF)   Patient does relate that he is having some chest wall pain in the left lower lateral aspect in the area of the 11th and 12th floating ribs.  This is likely how he is sitting or laying.  We will be glad to review reevaluate and have patient come in for further testing if necessary. DOtilio Miu MD

## 2022-01-06 ENCOUNTER — Ambulatory Visit
Admission: RE | Admit: 2022-01-06 | Discharge: 2022-01-06 | Disposition: A | Discharge status: Home / Self Care | Payer: No Typology Code available for payment source | Source: Ambulatory Visit | Attending: Family Medicine | Admitting: Family Medicine

## 2022-01-06 ENCOUNTER — Ambulatory Visit
Admission: RE | Admit: 2022-01-06 | Discharge: 2022-01-06 | Disposition: A | Discharge status: Home / Self Care | Payer: No Typology Code available for payment source | Attending: Family Medicine | Admitting: Family Medicine

## 2022-01-06 ENCOUNTER — Ambulatory Visit (INDEPENDENT_AMBULATORY_CARE_PROVIDER_SITE_OTHER): Payer: No Typology Code available for payment source | Admitting: Family Medicine

## 2022-01-06 ENCOUNTER — Encounter: Payer: Self-pay | Admitting: Family Medicine

## 2022-01-06 VITALS — BP 120/76 | HR 74 | Ht 67.0 in | Wt 182.0 lb

## 2022-01-06 DIAGNOSIS — S39012A Strain of muscle, fascia and tendon of lower back, initial encounter: Secondary | ICD-10-CM

## 2022-01-06 DIAGNOSIS — R0781 Pleurodynia: Secondary | ICD-10-CM | POA: Diagnosis present

## 2022-01-06 MED ORDER — MELOXICAM 15 MG PO TABS: 15.0000 mg | ORAL_TABLET | Freq: Every day | ORAL | 0 refills | Status: AC

## 2022-01-06 NOTE — Progress Notes (Signed)
Date:  01/06/2022   Name:  Melvin SOTH Sr.   DOB:  11/19/1960   MRN:  [id number]   Chief Complaint: Flank Pain (L) sided comes and goes)  Flank Pain This is a new problem. The current episode started yesterday. The problem has been waxing and waning since onset. The pain is present in the lumbar spine. The quality of the pain is described as aching. The pain is at a severity of 3/10. The symptoms are aggravated by bending and twisting. Associated symptoms include chest pain. Pertinent negatives include no abdominal pain, dysuria, fever, headaches, leg pain, numbness or weakness.  Chest Pain  The current episode started 1 to 4 weeks ago. The onset quality is gradual. The problem occurs intermittently. The problem has been gradually improving. The pain is present in the lateral region. The pain is mild. The quality of the pain is described as dull ("pulled muscle"). Pertinent negatives include no abdominal pain, cough, diaphoresis, fever, headaches, leg pain, malaise/fatigue, numbness, palpitations, shortness of breath or weakness. He has tried acetaminophen for the symptoms. The treatment provided mild relief.    Lab Results  Component Value Date   NA 138 06/19/2020   K 4.3 06/19/2020   CO2 21 06/19/2020   GLUCOSE 80 06/19/2020   BUN 21 11/22/2020   CREATININE 1.3 11/22/2020   CALCIUM 9.6 06/19/2020   EGFR 62 06/19/2020   GFRNONAA 64 10/23/2019   Lab Results  Component Value Date   CHOL 183 11/22/2020   HDL 56 11/22/2020   LDLCALC 90 11/22/2020   TRIG 184 (A) 11/22/2020   CHOLHDL 4.0 06/10/2018   Lab Results  Component Value Date   TSH 1.40 11/22/2020   Lab Results  Component Value Date   HGBA1C 7.8 12/19/2021   Lab Results  Component Value Date   WBC 15.1 (H) 08/27/2015   HGB 15.6 08/27/2015   HCT 46.2 08/27/2015   MCV 91.9 08/27/2015   PLT 484 (H) 08/27/2015   Lab Results  Component Value Date   ALT 20 11/22/2020   AST 14 11/22/2020   ALKPHOS 70  06/19/2020   BILITOT 0.2 06/19/2020   Lab Results  Component Value Date   VD25OH 29.6 (L) 04/12/2015     Review of Systems  Constitutional:  Negative for diaphoresis, fatigue, fever, malaise/fatigue and unexpected weight change.  Respiratory:  Negative for cough, chest tightness, shortness of breath and wheezing.   Cardiovascular:  Positive for chest pain. Negative for palpitations.  Gastrointestinal:  Negative for abdominal distention, abdominal pain, blood in stool, constipation and diarrhea.  Genitourinary:  Positive for flank pain. Negative for dysuria.  Neurological:  Negative for weakness, numbness and headaches.    Patient Active Problem List   Diagnosis Date Noted   Benign prostatic hyperplasia without lower urinary tract symptoms 06/21/2017   Special screening for malignant neoplasms, colon    Benign neoplasm of transverse colon    Benign neoplasm of ascending colon    Obesity, Class I, BMI 30-34.9 07/12/2015   Overweight 04/12/2015   Left carotid stenosis 04/12/2015   Tinea corporis 10/03/2014   Type 2 diabetes mellitus with diabetic neuropathy, without long-term current use of insulin ([place]) 04/26/2014   HLD (hyperlipidemia) 04/26/2014   HTN (hypertension) 04/26/2014   H/O splenectomy 04/26/2014   Anterior circulation transient ischemic attack 04/26/2014   WPW (Wolff-Parkinson-White syndrome) 07/11/2012    No Known Allergies  Past Surgical History:  Procedure Laterality Date   CARDIAC CATHETERIZATION  over 20 yrs ago.  no issues per pt.   COLONOSCOPY WITH PROPOFOL N/A 02/12/2017   Procedure: COLONOSCOPY WITH PROPOFOL;  Surgeon: Lucilla Lame, MD;  Location: [facility];  Service: Endoscopy;  Laterality: N/A;  Diabetic - oral meds   FOOT SURGERY     HERNIA REPAIR     x2   POLYPECTOMY  02/12/2017   Procedure: POLYPECTOMY;  Surgeon: Lucilla Lame, MD;  Location: [facility];  Service: Endoscopy;;   SPLENECTOMY      Social History   Tobacco Use    Smoking status: Former    Types: Cigarettes    Quit date: 1997    Years since quitting: 27.0   Smokeless tobacco: Never  Vaping Use   Vaping Use: Never used  Substance Use Topics   Alcohol use: No    Alcohol/week: 0.0 standard drinks of alcohol   Drug use: No     Medication list has been reviewed and updated.  Current Meds  Medication Sig   amLODipine (NORVASC) 5 MG tablet Take 1 tablet (5 mg total) by mouth daily.   aspirin 81 MG tablet Take 81 mg by mouth daily.   atorvastatin (LIPITOR) 40 MG tablet Take 1 tablet (40 mg total) by mouth daily.   blood glucose meter kit and supplies Dispense based on patient and insurance preference. Use up to four times daily as directed. (FOR ICD-9 250.00, 250.01).   CONTOUR NEXT TEST test strip USE TO CHECK BLOOD SUGAR ONCE DAILY   FARXIGA 10 MG TABS tablet Take 10 mg by mouth daily.   glipiZIDE (GLUCOTROL XL) 10 MG 24 hr tablet Take 1 tablet by mouth once daily with breakfast (Patient taking differently: 20 mg daily with breakfast. 2 tablets daily)   lisinopril (ZESTRIL) 20 MG tablet Take 1 tablet (20 mg total) by mouth daily.   metFORMIN (GLUCOPHAGE) 1000 MG tablet Take 1 tablet by mouth twice daily   MICROLET LANCETS MISC USE ONE  TO CHECK GLUCOSE ONCE DAILY   pioglitazone (ACTOS) 45 MG tablet TAKE 1 TABLET BY MOUTH ONCE DAILY. KEEP APPOINTMENT FOR MAY.   tamsulosin (FLOMAX) 0.4 MG CAPS capsule Take 1 capsule (0.4 mg total) by mouth daily.       01/06/2022    8:29 AM 12/22/2021    8:24 AM 06/02/2021    9:26 AM 12/02/2020   10:36 AM  GAD 7 : Generalized Anxiety Score  Nervous, Anxious, on Edge 0 0 1 0  Control/stop worrying 0 0 0 0  Worry too much - different things 0 0 0 0  Trouble relaxing 0 0 0 0  Restless 0 0 3 0  Easily annoyed or irritable 0 0 3 1  Afraid - awful might happen 0 0 0 0  Total GAD 7 Score 0 0 7 1  Anxiety Difficulty Not difficult at all Not difficult at all Not difficult at all Not difficult at all        01/06/2022    8:29 AM 12/22/2021    8:24 AM 06/02/2021    9:25 AM  Depression screen PHQ 2/9  Decreased Interest 0 0 0  Down, Depressed, Hopeless 0 0 0  PHQ - 2 Score 0 0 0  Altered sleeping 0 0 2  Tired, decreased energy 0 0 2  Change in appetite 0 0 3  Feeling bad or failure about yourself  0 0 0  Trouble concentrating 0 0 0  Moving slowly or fidgety/restless 0 0 0  Suicidal thoughts  0 0 0  PHQ-9 Score 0 0 7  Difficult doing work/chores Not difficult at all Not difficult at all Not difficult at all    BP Readings from Last 3 Encounters:  01/06/22 120/76  12/22/21 118/76  06/02/21 130/80    Physical Exam Vitals and nursing note reviewed.  HENT:     Head: Normocephalic.     Right Ear: Tympanic membrane, ear canal and external ear normal.     Left Ear: Tympanic membrane, ear canal and external ear normal.     Nose: Nose normal.  Eyes:     General: No scleral icterus.       Right eye: No discharge.        Left eye: No discharge.     Conjunctiva/sclera: Conjunctivae normal.     Pupils: Pupils are equal, round, and reactive to light.  Neck:     Thyroid: No thyromegaly.     Vascular: No JVD.     Trachea: No tracheal deviation.  Cardiovascular:     Rate and Rhythm: Normal rate and regular rhythm.     Heart sounds: Normal heart sounds. No murmur heard.    No friction rub. No gallop.  Pulmonary:     Effort: No respiratory distress.     Breath sounds: Normal breath sounds. No wheezing, rhonchi or rales.  Chest:     Chest wall: Tenderness present.     Comments: Lateral/9th/ no palpable defect/ tenderness noted Abdominal:     General: Bowel sounds are normal.     Palpations: Abdomen is soft. There is no mass.     Tenderness: There is no abdominal tenderness. There is no guarding or rebound.  Musculoskeletal:        General: No tenderness. Normal range of motion.     Cervical back: Normal range of motion and neck supple.     Lumbar back: Spasms present. No tenderness.  Normal range of motion. Negative right straight leg raise test and negative left straight leg raise test.  Lymphadenopathy:     Cervical: No cervical adenopathy.  Skin:    General: Skin is warm.     Findings: No rash.  Neurological:     Mental Status: He is alert and oriented to person, place, and time.     Cranial Nerves: No cranial nerve deficit.     Deep Tendon Reflexes: Reflexes are normal and symmetric.     Wt Readings from Last 3 Encounters:  01/06/22 182 lb (82.6 kg)  12/22/21 181 lb (82.1 kg)  06/02/21 181 lb (82.1 kg)    BP 120/76   Pulse 74   Ht _0  (1.702 m)   Wt 182 lb (82.6 kg)   SpO2 98%   BMI 28.51 kg/m   Assessment and Plan: 1. Rib pain on left side Relatively new onset.  Present for 3 to 4 weeks.  Patient during does not recall any specific episode.  There is point tenderness with no significant defect noted in the around the right rib margin on the lateral aspect that is marked with surrounding intercostal muscles tender as well.  There is no palpable mass in the left lower quadrant and patient has had a splenectomy.  We will obtain an x-ray of the rib detail on that side and initiate meloxicam 15 mg daily - meloxicam (MOBIC) 15 MG tablet; Take 1 tablet (15 mg total) by mouth daily.  Dispense: 30 tablet; Refill: 0  2. Strain of lumbar region, initial encounter New onset.  Left lumbar area with palpable spasm.  Neurologic exam is unremarkable.  This is consistent with a strain in the lumbar area and the meloxicam we have initiated above should also assist with this as well. - meloxicam (MOBIC) 15 MG tablet; Take 1 tablet (15 mg total) by mouth daily.  Dispense: 30 tablet; Refill: 0     Otilio Miu, MD

## 2022-01-26 LAB — HEMOGLOBIN A1C: Hemoglobin A1C: 7.6

## 2022-02-11 ENCOUNTER — Other Ambulatory Visit: Payer: Self-pay | Admitting: Family Medicine

## 2022-02-11 DIAGNOSIS — E1140 Type 2 diabetes mellitus with diabetic neuropathy, unspecified: Secondary | ICD-10-CM

## 2022-02-12 NOTE — Telephone Encounter (Signed)
Requested medication (s) are due for refill today: yes  Requested medication (s) are on the active medication list: yes  Last refill:  05/19/21  Future visit scheduled: yes  Notes to clinic:  labs in date but per pt's chart has not had refill since May 2023   Requested Prescriptions  Pending Prescriptions Disp Refills   metFORMIN (GLUCOPHAGE) 1000 MG tablet [Pharmacy Med Name: metFORMIN HCl 1000 MG Oral Tablet] 180 tablet 0    Sig: TAKE 1 TABLET BY MOUTH TWICE DAILY -  [place]     Endocrinology:  Diabetes - Biguanides Failed - 02/11/2022  5:36 PM      Failed - Cr in normal range and within 360 days    Creatinine  Date Value Ref Range Status  11/22/2020 1.3 0.6 - 1.3 Final  04/12/2011 1.33 (H) 0.60 - 1.30 mg/dL Final   Creatinine, Ser  Date Value Ref Range Status  06/19/2020 1.32 (H) 0.76 - 1.27 mg/dL Final         Failed - eGFR in normal range and within 360 days    EGFR (African American)  Date Value Ref Range Status  04/12/2011 >60 >50m/min Final   GFR calc Af Amer  Date Value Ref Range Status  10/23/2019 74 >59 mL/min/1.73 Final    Comment:    **[facility] currently reports eGFR in compliance with the current**   recommendations of the [employer] [facility] will   update reporting as new guidelines are published from the NKF-ASN   Task force.    EGFR (Non-African Amer.)  Date Value Ref Range Status  04/12/2011 >60 >6109mmin Final    Comment:    eGFR values <6027min/1.73 m2 may be an indication of chronic kidney disease (CKD). Calculated eGFR, using the MRDR Study equation, is useful in  patients with stable renal function. The eGFR calculation will not be reliable in acutely ill patients when serum creatinine is changing rapidly. It is not useful in patients on dialysis. The eGFR calculation may not be applicable to patients at the low and high extremes of body sizes, pregnant women, and vegetarians.    GFR calc non Af Amer  Date  Value Ref Range Status  10/23/2019 64 >59 mL/min/1.73 Final   eGFR  Date Value Ref Range Status  06/19/2020 62 >59 mL/min/1.73 Final         Failed - B12 Level in normal range and within 720 days    Vitamin B-12  Date Value Ref Range Status  04/12/2015 418 211 - 946 pg/mL Final         Failed - CBC within normal limits and completed in the last 12 months    WBC  Date Value Ref Range Status  08/27/2015 15.1 (H) 3.8 - 10.6 K/uL Final   RBC  Date Value Ref Range Status  08/27/2015 5.02 4.40 - 5.90 MIL/uL Final   Hemoglobin  Date Value Ref Range Status  08/27/2015 15.6 13.0 - 18.0 g/dL Final  10/03/2014 15.4 12.6 - 17.7 g/dL Final   HCT  Date Value Ref Range Status  08/27/2015 46.2 40.0 - 52.0 % Final   Hematocrit  Date Value Ref Range Status  10/03/2014 45.3 37.5 - 51.0 % Final   MCHC  Date Value Ref Range Status  08/27/2015 33.8 32.0 - 36.0 g/dL Final   [facility] Value Ref Range Status  08/27/2015 31.1 26.0 - 34.0 pg Final   MCV  Date Value Ref Range Status  08/27/2015 91.9 80.0 -  100.0 fL Final  10/03/2014 93 79 - 97 fL Final  04/12/2011 95 80 - 100 fL Final   No results found for: "PLTCOUNTKUC", "LABPLAT", "POCPLA" RDW  Date Value Ref Range Status  08/27/2015 13.1 11.5 - 14.5 % Final  10/03/2014 13.5 12.3 - 15.4 % Final  04/12/2011 12.9 11.5 - 14.5 % Final         Passed - HBA1C is between 0 and 7.9 and within 180 days    Hemoglobin A1C  Date Value Ref Range Status  12/19/2021 7.8  Final         Passed - Valid encounter within last 6 months    Recent Outpatient Visits           1 month ago Rib pain on left side   [facility] [place], Deanna C, MD   1 month ago Essential hypertension   [place] [facility] [place], Deanna C, MD   8 months ago Essential hypertension   [place] [facility] [place], Deanna C, MD   1  year ago Need for immunization against influenza   [place] [facility] [place], Deanna C, MD   1 year ago Essential hypertension   [place] [facility] [place], Nixon, MD       Future Appointments             In 4 months Juline Patch, MD [place] [facility], [facility]

## 2022-06-16 ENCOUNTER — Other Ambulatory Visit: Payer: Self-pay | Admitting: Family Medicine

## 2022-06-16 DIAGNOSIS — E782 Mixed hyperlipidemia: Secondary | ICD-10-CM

## 2022-06-16 NOTE — Telephone Encounter (Signed)
Requested medications are due for refill today.  yes  Requested medications are on the active medications list.  yes  Last refill. 12/22/2021 #90 1 rf  Future visit scheduled.   yes  Notes to clinic.  Labs are expired.    Requested Prescriptions  Pending Prescriptions Disp Refills   atorvastatin (LIPITOR) 40 MG tablet [Pharmacy Med Name: Atorvastatin Calcium 40 MG Oral Tablet] 90 tablet 0    Sig: Take 1 tablet by mouth once daily     Cardiovascular:  Antilipid - Statins Failed - 06/16/2022 10:24 AM      Failed - Lipid Panel in normal range within the last 12 months    Cholesterol, Total  Date Value Ref Range Status  02/13/2019 153 100 - 199 mg/dL Final   Cholesterol  Date Value Ref Range Status  11/22/2020 183 0 - 200 Final   LDL Chol Calc (NIH)  Date Value Ref Range Status  02/13/2019 77 0 - 99 mg/dL Final   LDL Cholesterol  Date Value Ref Range Status  11/22/2020 90  Final   HDL  Date Value Ref Range Status  11/22/2020 56 35 - 70 Final  02/13/2019 55 >39 mg/dL Final   Triglycerides  Date Value Ref Range Status  11/22/2020 184 (A) 40 - 160 Final         Passed - Patient is not pregnant      Passed - Valid encounter within last 12 months    Recent Outpatient Visits           5 months ago Rib pain on left side   [facility], MD   5 months ago Essential hypertension   [place] [facility], MD   1 year ago Essential hypertension   [place] [facility], MD   1 year ago Need for immunization against influenza   [facility], MD   1 year ago Essential hypertension   [place] [facility], MD       Future Appointments             In 1 week Duanne Limerick,  MD [facility] at [facility], [facility]

## 2022-06-19 LAB — HEMOGLOBIN A1C: Hemoglobin A1C: 9.3

## 2022-06-22 ENCOUNTER — Ambulatory Visit: Payer: No Typology Code available for payment source | Admitting: Family Medicine

## 2022-06-22 ENCOUNTER — Encounter: Payer: Self-pay | Admitting: Family Medicine

## 2022-06-22 ENCOUNTER — Other Ambulatory Visit
Admission: RE | Admit: 2022-06-22 | Discharge: 2022-06-22 | Disposition: A | Discharge status: Home / Self Care | Payer: No Typology Code available for payment source | Attending: Family Medicine | Admitting: Family Medicine

## 2022-06-22 VITALS — BP 120/64 | HR 82 | Ht 67.0 in | Wt 185.0 lb

## 2022-06-22 DIAGNOSIS — N400 Enlarged prostate without lower urinary tract symptoms: Secondary | ICD-10-CM

## 2022-06-22 DIAGNOSIS — E782 Mixed hyperlipidemia: Secondary | ICD-10-CM | POA: Diagnosis not present

## 2022-06-22 DIAGNOSIS — Z7984 Long term (current) use of oral hypoglycemic drugs: Secondary | ICD-10-CM

## 2022-06-22 DIAGNOSIS — I10 Essential (primary) hypertension: Secondary | ICD-10-CM | POA: Insufficient documentation

## 2022-06-22 DIAGNOSIS — E785 Hyperlipidemia, unspecified: Secondary | ICD-10-CM | POA: Diagnosis not present

## 2022-06-22 DIAGNOSIS — E118 Type 2 diabetes mellitus with unspecified complications: Secondary | ICD-10-CM | POA: Diagnosis not present

## 2022-06-22 DIAGNOSIS — E1140 Type 2 diabetes mellitus with diabetic neuropathy, unspecified: Secondary | ICD-10-CM

## 2022-06-22 LAB — PSA: Prostatic Specific Antigen: 0.9 ng/mL (ref 0.00–4.00)

## 2022-06-22 LAB — LIPID PANEL
Cholesterol: 164 mg/dL (ref 0–200)
HDL: 64 mg/dL (ref >40)
LDL Cholesterol: 80 mg/dL (ref 0–99)
Total CHOL/HDL Ratio: 2.6 RATIO
Triglycerides: 101 mg/dL (ref <150)
VLDL: 20 mg/dL (ref 0–40)

## 2022-06-22 LAB — RENAL FUNCTION PANEL
Albumin: 4.3 g/dL (ref 3.5–5.0)
Anion gap: 7 (ref 5–15)
BUN: 18 mg/dL (ref 8–23)
CO2: 27 mmol/L (ref 22–32)
Calcium: 9.4 mg/dL (ref 8.9–10.3)
Chloride: 100 mmol/L (ref 98–111)
Creatinine, Ser: 1.24 mg/dL (ref 0.61–1.24)
GFR, Estimated: >60 mL/min (ref >60)
Glucose, Bld: 150 mg/dL — ABNORMAL HIGH (ref 70–99)
Phosphorus: 3.3 mg/dL (ref 2.5–4.6)
Potassium: 4.5 mmol/L (ref 3.5–5.1)
Sodium: 134 mmol/L — ABNORMAL LOW (ref 135–145)

## 2022-06-22 MED ORDER — TAMSULOSIN HCL 0.4 MG PO CAPS: 0.4000 mg | ORAL_CAPSULE | Freq: Every day | ORAL | 1 refills | Status: DC

## 2022-06-22 MED ORDER — LISINOPRIL 20 MG PO TABS: 20.0000 mg | ORAL_TABLET | Freq: Every day | ORAL | 1 refills | Status: DC

## 2022-06-22 MED ORDER — AMLODIPINE BESYLATE 5 MG PO TABS: 5.0000 mg | ORAL_TABLET | Freq: Every day | ORAL | 1 refills | Status: DC

## 2022-06-22 MED ORDER — ATORVASTATIN CALCIUM 40 MG PO TABS: 40.0000 mg | ORAL_TABLET | Freq: Every day | ORAL | 1 refills | Status: DC

## 2022-06-22 NOTE — Progress Notes (Signed)
Date:  06/22/2022   Name:  Melvin BONAWITZ Sr.   DOB:  [birth year]   MRN:  [id number]   Chief Complaint: Hypertension, Hyperlipidemia, and Benign Prostatic Hypertrophy  Hypertension This is a chronic problem. The current episode started more than 1 year ago. The problem has been gradually improving since onset. The problem is controlled. Pertinent negatives include no anxiety, blurred vision, chest pain, headaches, malaise/fatigue, neck pain, orthopnea, palpitations, peripheral edema, PND, shortness of breath or sweats. There are no associated agents to hypertension. There are no known risk factors for coronary artery disease. Past treatments include calcium channel blockers and ACE inhibitors. The current treatment provides moderate improvement. There are no compliance problems.  There is no history of angina, kidney disease, CAD/MI, CVA, heart failure, left ventricular hypertrophy, PVD or retinopathy. There is no history of chronic renal disease, a hypertension causing med or renovascular disease.  Hyperlipidemia This is a chronic problem. The current episode started more than 1 year ago. The problem is controlled. Recent lipid tests were reviewed and are normal. He has no history of chronic renal disease, diabetes, hypothyroidism, liver disease, obesity or nephrotic syndrome. There are no known factors aggravating his hyperlipidemia. Pertinent negatives include no chest pain, focal sensory loss, focal weakness, leg pain, myalgias or shortness of breath. Current antihyperlipidemic treatment includes statins. The current treatment provides moderate improvement of lipids. There are no compliance problems.  Risk factors for coronary artery disease include hypertension, male sex and dyslipidemia.  Benign Prostatic Hypertrophy This is a chronic problem. The current episode started more than 1 year ago. The problem has been gradually improving since onset. Irritative symptoms include nocturia. Irritative  symptoms do not include frequency or urgency. Obstructive symptoms do not include dribbling, incomplete emptying, an intermittent stream, a slower stream, straining or a weak stream. Pertinent negatives include no hematuria.    Lab Results  Component Value Date   NA 138 06/19/2020   K 4.3 06/19/2020   CO2 21 06/19/2020   GLUCOSE 80 06/19/2020   BUN 21 11/22/2020   CREATININE 1.3 11/22/2020   CALCIUM 9.6 06/19/2020   EGFR 62 06/19/2020   GFRNONAA 64 10/23/2019   Lab Results  Component Value Date   CHOL 183 11/22/2020   HDL 56 11/22/2020   LDLCALC 90 11/22/2020   TRIG 184 (A) 11/22/2020   CHOLHDL 4.0 06/10/2018   Lab Results  Component Value Date   TSH 1.40 11/22/2020   Lab Results  Component Value Date   HGBA1C 9.3 06/19/2022   Lab Results  Component Value Date   WBC 15.1 (H) 08/27/2015   HGB 15.6 08/27/2015   HCT 46.2 08/27/2015   MCV 91.9 08/27/2015   PLT 484 (H) 08/27/2015   Lab Results  Component Value Date   ALT 20 11/22/2020   AST 14 11/22/2020   ALKPHOS 70 06/19/2020   BILITOT 0.2 06/19/2020   Lab Results  Component Value Date   VD25OH 29.6 (L) 04/12/2015     Review of Systems  Constitutional:  Negative for fatigue, malaise/fatigue and unexpected weight change.  HENT:  Negative for trouble swallowing.   Eyes:  Negative for blurred vision and visual disturbance.  Respiratory:  Negative for shortness of breath and wheezing.   Cardiovascular:  Negative for chest pain, palpitations, orthopnea, leg swelling and PND.  Gastrointestinal:  Negative for abdominal pain, blood in stool, constipation and diarrhea.  Endocrine: Negative for polydipsia and polyuria.  Genitourinary:  Positive for nocturia. Negative for decreased urine  volume, difficulty urinating, flank pain, frequency, hematuria, incomplete emptying and urgency.  Musculoskeletal:  Negative for myalgias and neck pain.  Skin:  Negative for color change.  Neurological:  Negative for dizziness, focal  weakness, light-headedness and headaches.  Hematological:  Negative for adenopathy.    Patient Active Problem List   Diagnosis Date Noted   Benign prostatic hyperplasia without lower urinary tract symptoms 06/21/2017   Special screening for malignant neoplasms, colon    Benign neoplasm of transverse colon    Benign neoplasm of ascending colon    Obesity, Class I, BMI 30-34.9 07/12/2015   Overweight 04/12/2015   Left carotid stenosis 04/12/2015   Tinea corporis 10/03/2014   Type 2 diabetes mellitus with diabetic neuropathy, without long-term current use of insulin (HCC) 04/26/2014   HLD (hyperlipidemia) 04/26/2014   HTN (hypertension) 04/26/2014   H/O splenectomy 04/26/2014   Anterior circulation transient ischemic attack 04/26/2014   WPW (Wolff-Parkinson-White syndrome) 07/11/2012    No Known Allergies  Past Surgical History:  Procedure Laterality Date   CARDIAC CATHETERIZATION     over 20 yrs ago.  no issues per pt.   COLONOSCOPY WITH PROPOFOL N/A 02/12/2017   Procedure: COLONOSCOPY WITH PROPOFOL;  Surgeon: Midge Minium, MD;  Location: [facility];  Service: Endoscopy;  Laterality: N/A;  Diabetic - oral meds   FOOT SURGERY     HERNIA REPAIR     x2   POLYPECTOMY  02/12/2017   Procedure: POLYPECTOMY;  Surgeon: Midge Minium, MD;  Location: [facility];  Service: Endoscopy;;   SPLENECTOMY      Social History   Tobacco Use   Smoking status: Former    Types: Cigarettes    Quit date: 1997    Years since quitting: 27.4   Smokeless tobacco: Never  Vaping Use   Vaping Use: Never used  Substance Use Topics   Alcohol use: No    Alcohol/week: 0.0 standard drinks of alcohol   Drug use: No     Medication list has been reviewed and updated.  Current Meds  Medication Sig   amLODipine (NORVASC) 5 MG tablet Take 1 tablet (5 mg total) by mouth daily.   aspirin 81 MG tablet Take 81 mg by mouth daily.   atorvastatin (LIPITOR) 40 MG tablet Take 1 tablet by mouth  once daily   blood glucose meter kit and supplies Dispense based on patient and insurance preference. Use up to four times daily as directed. (FOR ICD-9 250.00, 250.01).   CONTOUR NEXT TEST test strip USE TO CHECK BLOOD SUGAR ONCE DAILY   FARXIGA 10 MG TABS tablet Take 10 mg by mouth daily.   glipiZIDE (GLUCOTROL XL) 10 MG 24 hr tablet Take 1 tablet by mouth once daily with breakfast (Patient taking differently: 20 mg daily with breakfast. 2 tablets daily)   lisinopril (ZESTRIL) 20 MG tablet Take 1 tablet (20 mg total) by mouth daily.   meloxicam (MOBIC) 15 MG tablet Take 1 tablet (15 mg total) by mouth daily.   metFORMIN (GLUCOPHAGE) 1000 MG tablet Take 1 tablet by mouth twice daily   MICROLET LANCETS MISC USE ONE  TO CHECK GLUCOSE ONCE DAILY   pioglitazone (ACTOS) 45 MG tablet TAKE 1 TABLET BY MOUTH ONCE DAILY. KEEP APPOINTMENT FOR MAY.   tamsulosin (FLOMAX) 0.4 MG CAPS capsule Take 1 capsule (0.4 mg total) by mouth daily.       06/22/2022    8:30 AM 01/06/2022    8:29 AM 12/22/2021    8:24 AM  06/02/2021    9:26 AM  GAD 7 : Generalized Anxiety Score  Nervous, Anxious, on Edge 0 0 0 1  Control/stop worrying 0 0 0 0  Worry too much - different things 0 0 0 0  Trouble relaxing 0 0 0 0  Restless 0 0 0 3  Easily annoyed or irritable 0 0 0 3  Afraid - awful might happen 0 0 0 0  Total GAD 7 Score 0 0 0 7  Anxiety Difficulty Not difficult at all Not difficult at all Not difficult at all Not difficult at all       06/22/2022    8:30 AM 01/06/2022    8:29 AM 12/22/2021    8:24 AM  Depression screen PHQ 2/9  Decreased Interest 0 0 0  Down, Depressed, Hopeless 0 0 0  PHQ - 2 Score 0 0 0  Altered sleeping 0 0 0  Tired, decreased energy 0 0 0  Change in appetite 0 0 0  Feeling bad or failure about yourself  0 0 0  Trouble concentrating 0 0 0  Moving slowly or fidgety/restless 0 0 0  Suicidal thoughts 0 0 0  PHQ-9 Score 0 0 0  Difficult doing work/chores Not difficult at all Not  difficult at all Not difficult at all    BP Readings from Last 3 Encounters:  06/22/22 120/64  01/06/22 120/76  12/22/21 118/76    Physical Exam Vitals and nursing note reviewed.  HENT:     Head: Normocephalic.     Right Ear: External ear normal.     Left Ear: External ear normal.     Nose: Nose normal.     Mouth/Throat:     Mouth: Mucous membranes are dry.  Eyes:     General: No scleral icterus.       Right eye: No discharge.        Left eye: No discharge.     Conjunctiva/sclera: Conjunctivae normal.     Pupils: Pupils are equal, round, and reactive to light.  Neck:     Thyroid: No thyromegaly.     Vascular: No JVD.     Trachea: No tracheal deviation.  Cardiovascular:     Rate and Rhythm: Normal rate and regular rhythm.     Heart sounds: Normal heart sounds, S1 normal and S2 normal. No murmur heard.    No systolic murmur is present.     No diastolic murmur is present.     No friction rub. No gallop. No S3 or S4 sounds.  Pulmonary:     Effort: No respiratory distress.     Breath sounds: Normal breath sounds. No decreased breath sounds, wheezing, rhonchi or rales.  Abdominal:     General: Bowel sounds are normal.     Palpations: Abdomen is soft. There is no mass.     Tenderness: There is no abdominal tenderness. There is no guarding or rebound.  Genitourinary:    Prostate: Normal. Not enlarged, not tender and no nodules present.     Rectum: Normal. Guaiac result negative. No mass or tenderness.  Musculoskeletal:        General: No tenderness. Normal range of motion.     Cervical back: Normal range of motion and neck supple.  Lymphadenopathy:     Cervical: No cervical adenopathy.  Skin:    General: Skin is warm.     Findings: No rash.  Neurological:     Mental Status: He is alert and oriented to person,  place, and time.     Cranial Nerves: No cranial nerve deficit.     Deep Tendon Reflexes: Reflexes are normal and symmetric.     Wt Readings from Last 3  Encounters:  06/22/22 185 lb (83.9 kg)  01/06/22 182 lb (82.6 kg)  12/22/21 181 lb (82.1 kg)    BP 120/64   Pulse 82   Ht 5\' 7"  (1.702 m)   Wt 185 lb (83.9 kg)   SpO2 96%   BMI 28.98 kg/m   Assessment and Plan: 1. Essential hypertension Chronic.  Controlled.  Stable.  Blood pressure 120/64.  Asymptomatic.  Tolerating medication well.  Will continue amlodipine 5 mg once a day and lisinopril 20 mg once a day.  Will check renal function panel for GFR and electrolytes.  Will recheck in 6 months. - amLODipine (NORVASC) 5 MG tablet; Take 1 tablet (5 mg total) by mouth daily.  Dispense: 90 tablet; Refill: 1 - lisinopril (ZESTRIL) 20 MG tablet; Take 1 tablet (20 mg total) by mouth daily.  Dispense: 90 tablet; Refill: 1 - Renal Function Panel  2. Mixed hyperlipidemia Chronic.  Controlled.  Stable.  Continue atorvastatin 40 mg once a day.  Tolerating medication well.  Will check lipid panel for current level of control. - atorvastatin (LIPITOR) 40 MG tablet; Take 1 tablet (40 mg total) by mouth daily.  Dispense: 90 tablet; Refill: 1 - Lipid Panel With LDL/HDL Ratio  3. Benign prostatic hyperplasia without lower urinary tract symptoms Chronic.  DRE IVUS noted normal size shape consistency.  Continue Flomax 0.4 mg daily will check PSA for current status of control. - tamsulosin (FLOMAX) 0.4 MG CAPS capsule; Take 1 capsule (0.4 mg total) by mouth daily.  Dispense: 90 capsule; Refill: 1 - PSA  4. Type 2 diabetes mellitus with diabetic neuropathy, without long-term current use of insulin (HCC) Chronic.  Controlled.  Stable.  Followed by endocrinology.  Will check microalbuminuria for current status of diabetic nephropathy concern. - Microalbumin / creatinine urine ratio     Elizabeth Sauer, MD

## 2022-06-23 ENCOUNTER — Ambulatory Visit: Payer: No Typology Code available for payment source | Admitting: Family Medicine

## 2022-06-23 LAB — MICROALBUMIN / CREATININE URINE RATIO
Creatinine, Urine: 55.9 mg/dL
Microalb Creat Ratio: <5 mg/g creat (ref 0–29)
Microalb, Ur: <3 ug/mL — ABNORMAL HIGH

## 2022-06-24 NOTE — Progress Notes (Signed)
PC to pt discussed labs, voiced understanding.

## 2022-12-08 ENCOUNTER — Ambulatory Visit: Payer: No Typology Code available for payment source | Admitting: Family Medicine

## 2022-12-21 ENCOUNTER — Encounter: Payer: Self-pay | Admitting: Family Medicine

## 2022-12-21 ENCOUNTER — Ambulatory Visit: Payer: No Typology Code available for payment source | Admitting: Family Medicine

## 2022-12-21 VITALS — BP 120/80 | HR 87 | Ht 67.0 in | Wt 164.4 lb

## 2022-12-21 DIAGNOSIS — I10 Essential (primary) hypertension: Secondary | ICD-10-CM

## 2022-12-21 DIAGNOSIS — N400 Enlarged prostate without lower urinary tract symptoms: Secondary | ICD-10-CM

## 2022-12-21 DIAGNOSIS — E782 Mixed hyperlipidemia: Secondary | ICD-10-CM

## 2022-12-21 MED ORDER — AMLODIPINE BESYLATE 5 MG PO TABS: 5.0000 mg | ORAL_TABLET | Freq: Every day | ORAL | 1 refills | Status: DC

## 2022-12-21 MED ORDER — LISINOPRIL 20 MG PO TABS: 20.0000 mg | ORAL_TABLET | Freq: Every day | ORAL | 1 refills | Status: DC

## 2022-12-21 MED ORDER — TAMSULOSIN HCL 0.4 MG PO CAPS: 0.4000 mg | ORAL_CAPSULE | Freq: Every day | ORAL | 1 refills | Status: DC

## 2022-12-21 MED ORDER — ATORVASTATIN CALCIUM 40 MG PO TABS: 40.0000 mg | ORAL_TABLET | Freq: Every day | ORAL | 1 refills | Status: DC

## 2022-12-21 NOTE — Progress Notes (Signed)
Date:  12/21/2022   Name:  Melvin WARNCKE Sr.   DOB:  1960-09-08   MRN:  [id number]   Chief Complaint: Follow-up (Patient here for follow on b/p. Patient has been taking b/p medication as directed and he is doing well. )  Hypertension This is a chronic problem. The current episode started more than 1 year ago. The problem has been gradually improving since onset. The problem is controlled. Pertinent negatives include no anxiety, blurred vision, chest pain, headaches, malaise/fatigue, neck pain, orthopnea, palpitations, peripheral edema, PND, shortness of breath or sweats. There are no associated agents to hypertension. There are no known risk factors for coronary artery disease. Past treatments include ACE inhibitors and calcium channel blockers. The current treatment provides moderate improvement. There are no compliance problems.  There is no history of angina, CAD/MI or CVA. There is no history of chronic renal disease, a hypertension causing med or renovascular disease.  Hyperlipidemia This is a chronic problem. The current episode started more than 1 year ago. The problem is controlled. Recent lipid tests were reviewed and are normal. He has no history of chronic renal disease, diabetes, hypothyroidism or nephrotic syndrome. Pertinent negatives include no chest pain or shortness of breath. Current antihyperlipidemic treatment includes statins. The current treatment provides moderate improvement of lipids. There are no compliance problems.  Risk factors for coronary artery disease include dyslipidemia and hypertension.  Benign Prostatic Hypertrophy This is a chronic problem. The current episode started more than 1 year ago. Irritative symptoms do not include frequency, nocturia or urgency. Obstructive symptoms do not include dribbling, an intermittent stream or a slower stream. Pertinent negatives include no chills. Nothing aggravates the symptoms. Past treatments include tamsulosin. The  treatment provided moderate relief.    Lab Results  Component Value Date   NA 134 (L) 06/22/2022   K 4.5 06/22/2022   CO2 27 06/22/2022   GLUCOSE 150 (H) 06/22/2022   BUN 18 06/22/2022   CREATININE 1.24 06/22/2022   CALCIUM 9.4 06/22/2022   EGFR 62 06/19/2020   GFRNONAA >60 06/22/2022   Lab Results  Component Value Date   CHOL 164 06/22/2022   HDL 64 06/22/2022   LDLCALC 80 06/22/2022   TRIG 101 06/22/2022   CHOLHDL 2.6 06/22/2022   Lab Results  Component Value Date   TSH 1.40 11/22/2020   Lab Results  Component Value Date   HGBA1C 9.3 06/19/2022   Lab Results  Component Value Date   WBC 15.1 (H) 08/27/2015   HGB 15.6 08/27/2015   HCT 46.2 08/27/2015   MCV 91.9 08/27/2015   PLT 484 (H) 08/27/2015   Lab Results  Component Value Date   ALT 20 11/22/2020   AST 14 11/22/2020   ALKPHOS 70 06/19/2020   BILITOT 0.2 06/19/2020   Lab Results  Component Value Date   VD25OH 29.6 (L) 04/12/2015     Review of Systems  Constitutional:  Negative for chills, fever and malaise/fatigue.  HENT:  Negative for congestion.   Eyes:  Negative for blurred vision and visual disturbance.  Respiratory:  Negative for cough, chest tightness, shortness of breath and wheezing.   Cardiovascular:  Negative for chest pain, palpitations, orthopnea and PND.  Gastrointestinal:  Negative for abdominal distention and blood in stool.  Genitourinary:  Negative for frequency, nocturia and urgency.  Musculoskeletal:  Negative for neck pain.  Neurological:  Negative for headaches.    Patient Active Problem List   Diagnosis Date Noted   Benign prostatic hyperplasia  without lower urinary tract symptoms 06/21/2017   Special screening for malignant neoplasms, colon    Benign neoplasm of transverse colon    Benign neoplasm of ascending colon    Obesity, Class I, BMI 30-34.9 07/12/2015   Overweight 04/12/2015   Left carotid stenosis 04/12/2015   Tinea corporis 10/03/2014   Type 2 diabetes  mellitus with diabetic neuropathy, without long-term current use of insulin (HCC) 04/26/2014   HLD (hyperlipidemia) 04/26/2014   HTN (hypertension) 04/26/2014   H/O splenectomy 04/26/2014   Anterior circulation transient ischemic attack 04/26/2014   WPW (Wolff-Parkinson-White syndrome) 07/11/2012    No Known Allergies  Past Surgical History:  Procedure Laterality Date   CARDIAC CATHETERIZATION     over 20 yrs ago.  no issues per pt.   COLONOSCOPY WITH PROPOFOL N/A 02/12/2017   Procedure: COLONOSCOPY WITH PROPOFOL;  Surgeon: Midge Minium, MD;  Location: [facility];  Service: Endoscopy;  Laterality: N/A;  Diabetic - oral meds   FOOT SURGERY     HERNIA REPAIR     x2   POLYPECTOMY  02/12/2017   Procedure: POLYPECTOMY;  Surgeon: Midge Minium, MD;  Location: [facility];  Service: Endoscopy;;   SPLENECTOMY      Social History   Tobacco Use   Smoking status: Former    Current packs/day: 0.00    Types: Cigarettes    Quit date: 1997    Years since quitting: 27.9   Smokeless tobacco: Never  Vaping Use   Vaping status: Never Used  Substance Use Topics   Alcohol use: No    Alcohol/week: 0.0 standard drinks of alcohol   Drug use: No     Medication list has been reviewed and updated.  Current Meds  Medication Sig   amLODipine (NORVASC) 5 MG tablet Take 1 tablet (5 mg total) by mouth daily.   aspirin 81 MG tablet Take 81 mg by mouth daily.   atorvastatin (LIPITOR) 40 MG tablet Take 1 tablet (40 mg total) by mouth daily.   blood glucose meter kit and supplies Dispense based on patient and insurance preference. Use up to four times daily as directed. (FOR ICD-9 250.00, 250.01).   CONTOUR NEXT TEST test strip USE TO CHECK BLOOD SUGAR ONCE DAILY   lisinopril (ZESTRIL) 20 MG tablet Take 1 tablet (20 mg total) by mouth daily.   meloxicam (MOBIC) 15 MG tablet Take 1 tablet (15 mg total) by mouth daily.   metFORMIN (GLUCOPHAGE) 1000 MG tablet Take 1 tablet by mouth twice  daily   MOUNJARO 7.5 MG/0.5ML Pen Inject 7.5 mg into the skin once a week.   pioglitazone (ACTOS) 45 MG tablet TAKE 1 TABLET BY MOUTH ONCE DAILY. KEEP APPOINTMENT FOR MAY.   tamsulosin (FLOMAX) 0.4 MG CAPS capsule Take 1 capsule (0.4 mg total) by mouth daily.       12/21/2022    3:57 PM 06/22/2022    8:30 AM 01/06/2022    8:29 AM 12/22/2021    8:24 AM  GAD 7 : Generalized Anxiety Score  Nervous, Anxious, on Edge 0 0 0 0  Control/stop worrying 0 0 0 0  Worry too much - different things 0 0 0 0  Trouble relaxing 0 0 0 0  Restless 0 0 0 0  Easily annoyed or irritable 0 0 0 0  Afraid - awful might happen 0 0 0 0  Total GAD 7 Score 0 0 0 0  Anxiety Difficulty Not difficult at all Not difficult at all Not difficult  at all Not difficult at all       12/21/2022    3:57 PM 06/22/2022    8:30 AM 01/06/2022    8:29 AM  Depression screen PHQ 2/9  Decreased Interest 0 0 0  Down, Depressed, Hopeless 0 0 0  PHQ - 2 Score 0 0 0  Altered sleeping 0 0 0  Tired, decreased energy 0 0 0  Change in appetite 0 0 0  Feeling bad or failure about yourself  0 0 0  Trouble concentrating 0 0 0  Moving slowly or fidgety/restless 0 0 0  Suicidal thoughts 0 0 0  PHQ-9 Score 0 0 0  Difficult doing work/chores Not difficult at all Not difficult at all Not difficult at all    BP Readings from Last 3 Encounters:  12/21/22 120/80  06/22/22 120/64  01/06/22 120/76    Physical Exam Vitals and nursing note reviewed.  HENT:     Head: Normocephalic.     Right Ear: Tympanic membrane, ear canal and external ear normal.     Left Ear: Tympanic membrane, ear canal and external ear normal.     Nose: Nose normal. No congestion or rhinorrhea.     Mouth/Throat:     Mouth: Mucous membranes are moist.  Eyes:     General: No scleral icterus.       Right eye: No discharge.        Left eye: No discharge.     Conjunctiva/sclera: Conjunctivae normal.     Pupils: Pupils are equal, round, and reactive to light.   Neck:     Thyroid: No thyromegaly.     Vascular: No JVD.     Trachea: No tracheal deviation.  Cardiovascular:     Rate and Rhythm: Normal rate and regular rhythm.     Heart sounds: Normal heart sounds. No murmur heard.    No friction rub. No gallop.  Pulmonary:     Effort: No respiratory distress.     Breath sounds: Normal breath sounds. No stridor. No wheezing, rhonchi or rales.  Chest:     Chest wall: No tenderness.  Abdominal:     General: Bowel sounds are normal.     Palpations: Abdomen is soft. There is no mass.     Tenderness: There is no abdominal tenderness. There is no guarding or rebound.  Musculoskeletal:        General: No tenderness. Normal range of motion.     Cervical back: Normal range of motion and neck supple.  Lymphadenopathy:     Cervical: No cervical adenopathy.  Skin:    General: Skin is warm.     Findings: No rash.  Neurological:     Mental Status: He is alert and oriented to person, place, and time.     Cranial Nerves: No cranial nerve deficit.     Deep Tendon Reflexes: Reflexes are normal and symmetric.     Wt Readings from Last 3 Encounters:  12/21/22 164 lb 6.4 oz (74.6 kg)  06/22/22 185 lb (83.9 kg)  01/06/22 182 lb (82.6 kg)    BP 120/80   Pulse 87   Ht 5\' 7"  (1.702 m)   Wt 164 lb 6.4 oz (74.6 kg)   SpO2 98%   BMI 25.75 kg/m   Assessment and Plan:  .1. Essential hypertension Chronic.  Controlled.  Stable.  Blood pressure today is 120/80.  Asymptomatic.  Tolerating medications well.  And will continue with amlodipine 5 mg once a day and  lisinopril 20 mg once a day.  Will check renal function panel for electrolytes and GFR.  Will recheck patient in 6 months. - amLODipine (NORVASC) 5 MG tablet; Take 1 tablet (5 mg total) by mouth daily.  Dispense: 90 tablet; Refill: 1 - lisinopril (ZESTRIL) 20 MG tablet; Take 1 tablet (20 mg total) by mouth daily.  Dispense: 90 tablet; Refill: 1 - Renal Function Panel  2. Mixed hyperlipidemia Monic.   Controlled.  Stable.  Tolerating medication well.  Without muscle weakness or discomfort.  Continue atorvastatin 40 mg once a day and we will check lipid panel for current status of LDL control. - atorvastatin (LIPITOR) 40 MG tablet; Take 1 tablet (40 mg total) by mouth daily.  Dispense: 90 tablet; Refill: 1 - Lipid Panel With LDL/HDL Ratio  3. Benign prostatic hyperplasia without lower urinary tract symptoms Chronic.  Controlled.  Stable.  Patient doing well with current dosing of tamsulosin 0.4 mg with reduction of nocturia and improved urinary flow. - tamsulosin (FLOMAX) 0.4 MG CAPS capsule; Take 1 capsule (0.4 mg total) by mouth daily.  Dispense: 90 capsule; Refill: 1    Elizabeth Sauer, MD

## 2022-12-22 LAB — LIPID PANEL WITH LDL/HDL RATIO
Cholesterol, Total: 153 mg/dL (ref 100–199)
HDL: 49 mg/dL (ref >39)
LDL Chol Calc (NIH): 88 mg/dL (ref 0–99)
LDL/HDL Ratio: 1.8 {ratio} (ref 0.0–3.6)
Triglycerides: 83 mg/dL (ref 0–149)
VLDL Cholesterol Cal: 16 mg/dL (ref 5–40)

## 2022-12-22 LAB — RENAL FUNCTION PANEL
Albumin: 4.3 g/dL (ref 3.9–4.9)
BUN/Creatinine Ratio: 10 (ref 10–24)
BUN: 10 mg/dL (ref 8–27)
CO2: 20 mmol/L (ref 20–29)
Calcium: 10.2 mg/dL (ref 8.6–10.2)
Chloride: 100 mmol/L (ref 96–106)
Creatinine, Ser: 1.02 mg/dL (ref 0.76–1.27)
Glucose: 143 mg/dL — ABNORMAL HIGH (ref 70–99)
Phosphorus: 3.1 mg/dL (ref 2.8–4.1)
Potassium: 4.4 mmol/L (ref 3.5–5.2)
Sodium: 140 mmol/L (ref 134–144)
eGFR: 83 mL/min/{1.73_m2} (ref >59)

## 2022-12-23 ENCOUNTER — Encounter: Payer: Self-pay | Admitting: Family Medicine

## 2023-04-10 ENCOUNTER — Other Ambulatory Visit: Payer: Self-pay

## 2023-04-10 ENCOUNTER — Emergency Department
Admission: EM | Admit: 2023-04-10 | Discharge: 2023-04-10 | Disposition: A | Discharge status: Home / Self Care | Attending: Emergency Medicine | Admitting: Emergency Medicine

## 2023-04-10 ENCOUNTER — Emergency Department

## 2023-04-10 DIAGNOSIS — R634 Abnormal weight loss: Secondary | ICD-10-CM | POA: Diagnosis not present

## 2023-04-10 DIAGNOSIS — I10 Essential (primary) hypertension: Secondary | ICD-10-CM | POA: Diagnosis not present

## 2023-04-10 DIAGNOSIS — E119 Type 2 diabetes mellitus without complications: Secondary | ICD-10-CM | POA: Diagnosis not present

## 2023-04-10 DIAGNOSIS — R1012 Left upper quadrant pain: Secondary | ICD-10-CM | POA: Diagnosis present

## 2023-04-10 DIAGNOSIS — R55 Syncope and collapse: Secondary | ICD-10-CM | POA: Diagnosis not present

## 2023-04-10 DIAGNOSIS — R42 Dizziness and giddiness: Secondary | ICD-10-CM | POA: Insufficient documentation

## 2023-04-10 DIAGNOSIS — K2900 Acute gastritis without bleeding: Secondary | ICD-10-CM | POA: Diagnosis not present

## 2023-04-10 LAB — BASIC METABOLIC PANEL WITH GFR
Anion gap: 8 (ref 5–15)
BUN: 17 mg/dL (ref 8–23)
CO2: 28 mmol/L (ref 22–32)
Calcium: 9.6 mg/dL (ref 8.9–10.3)
Chloride: 102 mmol/L (ref 98–111)
Creatinine, Ser: 1.49 mg/dL — ABNORMAL HIGH (ref 0.61–1.24)
GFR, Estimated: 52 mL/min — ABNORMAL LOW
Glucose, Bld: 153 mg/dL — ABNORMAL HIGH (ref 70–99)
Potassium: 3.5 mmol/L (ref 3.5–5.1)
Sodium: 138 mmol/L (ref 135–145)

## 2023-04-10 LAB — CBC
HCT: 41.2 % (ref 39.0–52.0)
Hemoglobin: 14.2 g/dL (ref 13.0–17.0)
MCH: 32.1 pg (ref 26.0–34.0)
MCHC: 34.5 g/dL (ref 30.0–36.0)
MCV: 93 fL (ref 80.0–100.0)
Platelets: 485 K/uL — ABNORMAL HIGH (ref 150–400)
RBC: 4.43 MIL/uL (ref 4.22–5.81)
RDW: 13.1 % (ref 11.5–15.5)
WBC: 10.8 K/uL — ABNORMAL HIGH (ref 4.0–10.5)
nRBC: 0 % (ref 0.0–0.2)

## 2023-04-10 LAB — TROPONIN I (HIGH SENSITIVITY)
Troponin I (High Sensitivity): 4 ng/L (ref <18)
Troponin I (High Sensitivity): 4 ng/L

## 2023-04-10 LAB — HEPATIC FUNCTION PANEL
ALT: 21 U/L (ref 0–44)
AST: 18 U/L (ref 15–41)
Albumin: 3.9 g/dL (ref 3.5–5.0)
Alkaline Phosphatase: 55 U/L (ref 38–126)
Bilirubin, Direct: 0.1 mg/dL (ref 0.0–0.2)
Indirect Bilirubin: 0.5 mg/dL (ref 0.3–0.9)
Total Bilirubin: 0.6 mg/dL (ref 0.0–1.2)
Total Protein: 6.9 g/dL (ref 6.5–8.1)

## 2023-04-10 LAB — LIPASE, BLOOD: Lipase: 50 U/L (ref 11–51)

## 2023-04-10 MED ORDER — PANTOPRAZOLE SODIUM 40 MG PO TBEC: 40.0000 mg | DELAYED_RELEASE_TABLET | Freq: Every day | ORAL | 0 refills | Status: AC

## 2023-04-10 MED ORDER — LACTATED RINGERS IV BOLUS
1000.0000 mL | Freq: Once | INTRAVENOUS | Status: AC
  Administered 2023-04-10: 1000 mL via INTRAVENOUS

## 2023-04-10 NOTE — ED Provider Notes (Signed)
 [facility] Provider Note    Event Date/Time   First MD Initiated Contact with Patient 04/10/23 1933     (approximate)   History   Chief Complaint Hypotension   HPI  Melvin LANUZA Sr. is a 63 y.o. male with past medical history of hypertension, hyperlipidemia, diabetes, and WPW who presents to the ED complaining of hypotension.  Patient reports that he suddenly began to feel dizzy and lightheaded at home earlier this afternoon.  He denies any associated chest pain, shortness of breath, or palpitations.  Family checked his blood pressure on multiple occasions and found systolic BPs to be in the 70s or 80s.  By the time EMS arrived, patient had a BP of 122/60.  He states that he feels back to normal at the time of my assessment, but does report intermittent left upper quadrant abdominal pain with decreased appetite for the past few months.  He denies significant abdominal pain at this time.     Physical Exam   Triage Vital Signs: ED Triage Vitals  Encounter Vitals Group     BP 04/10/23 1631 101/63     Systolic BP Percentile --      Diastolic BP Percentile --      Pulse Rate 04/10/23 1631 82     Resp 04/10/23 1631 16     Temp 04/10/23 1631 98.6 F (37 C)     Temp Source 04/10/23 1631 Oral     SpO2 04/10/23 1631 98 %     Weight --      Height 04/10/23 1632 5\' 7"  (1.702 m)     Head Circumference --      Peak Flow --      Pain Score 04/10/23 1632 0     Pain Loc --      Pain Education --      Exclude from Growth Chart --     Most recent vital signs: Vitals:   04/10/23 1631 04/10/23 1821  BP: 101/63 104/73  Pulse: 82 73  Resp: 16 16  Temp: 98.6 F (37 C) 98.3 F (36.8 C)  SpO2: 98% 100%    Constitutional: Alert and oriented. Eyes: Conjunctivae are normal. Head: Atraumatic. Nose: No congestion/rhinnorhea. Mouth/Throat: Mucous membranes are moist.  Cardiovascular: Normal rate, regular rhythm. Grossly normal heart sounds.  2+ radial  pulses bilaterally. Respiratory: Normal respiratory effort.  No retractions. Lungs CTAB. Gastrointestinal: Soft and nontender. No distention. Musculoskeletal: No lower extremity tenderness nor edema.  Neurologic:  Normal speech and language. No gross focal neurologic deficits are appreciated.    ED Results / Procedures / Treatments   Labs (all labs ordered are listed, but only abnormal results are displayed) Labs Reviewed  BASIC METABOLIC PANEL WITH GFR - Abnormal; Notable for the following components:      Result Value   Glucose, Bld 153 (*)    Creatinine, Ser 1.49 (*)    GFR, Estimated 52 (*)    All other components within normal limits  CBC - Abnormal; Notable for the following components:   WBC 10.8 (*)    Platelets 485 (*)    All other components within normal limits  HEPATIC FUNCTION PANEL  LIPASE, BLOOD  TROPONIN I (HIGH SENSITIVITY)  TROPONIN I (HIGH SENSITIVITY)     EKG  ED ECG REPORT I, Chesley Noon, the attending physician, personally viewed and interpreted this ECG.   Date: 04/10/2023  EKG Time: 16:34  Rate: 80  Rhythm: normal sinus rhythm  Axis: LAD  Intervals:right bundle branch block  ST&T Change: None  RADIOLOGY Chest x-ray reviewed and interpreted by me with no infiltrate, edema, or effusion.  PROCEDURES:  Critical Care performed: No  Procedures   MEDICATIONS ORDERED IN ED: Medications  lactated ringers bolus 1,000 mL (1,000 mLs Intravenous New Bag/Given 04/10/23 2030)     IMPRESSION / MDM / ASSESSMENT AND PLAN / ED COURSE  I reviewed the triage vital signs and the nursing notes.                              63 y.o. male with past medical history of hypertension, hyperlipidemia, diabetes, and WPW who presents to the ED complaining of lightheadedness with hypotension at home, since resolved.  Patient's presentation is most consistent with acute presentation with potential threat to life or bodily function.  Differential diagnosis  includes, but is not limited to, arrhythmia, ACS, vasovagal episode, dehydration, electrolyte abnormality, AKI, hepatitis, pancreatitis.  Patient nontoxic-appearing and in no acute distress, vital signs are unremarkable.  Symptoms seem to have resolved, EKG without evidence of arrhythmia or ischemia and troponin within normal limits.  I doubt cardiac etiology and vasovagal episode seems more likely.  Additional labs with mild AKI but no acute electrolyte abnormality, anemia, or leukocytosis.  LFTs and lipase are unremarkable and patient currently has a benign abdominal exam.  With his intermittent left upper quadrant abdominal pain and poor appetite, he may have some gastritis and we will start him on a PPI.  He was encouraged to follow-up with his PCP as well as GI if he continues to have issues.  He was counseled to return to the ED for new or worsening symptoms.  Patient agrees with plan.      FINAL CLINICAL IMPRESSION(S) / ED DIAGNOSES   Final diagnoses:  Near syncope  Acute gastritis without hemorrhage, unspecified gastritis type  Weight loss     Rx / DC Orders   ED Discharge Orders          Ordered    pantoprazole (PROTONIX) 40 MG tablet  Daily        04/10/23 2043             Note:  This document was prepared using Dragon voice recognition software and may include unintentional dictation errors.   Chesley Noon, MD 04/10/23 [phone]

## 2023-04-10 NOTE — ED Triage Notes (Addendum)
 First Nurse Note.  Pt via [facility] from home. Pt c/o weakness and pale. Family checked BP 70 SBP initally. EMS report when they arrive BP was 122/60. Negative for orthostatic changes. Pt c/o LLQ pain and flank pain for the past 6 months. Denies CP/SOB. Pt is A&Ox4 and NAD 18 G L AC, EMS gave bolus of LR  188 CBG with hx of DM  RBBB on EKG per EMS

## 2023-04-10 NOTE — ED Triage Notes (Addendum)
 Pt to ed from home via [facility] for hypotension. See first nurse note.  Per pt has has lost about 15 LBS since his las PCP visit in 12/2022 and does not take his meds as prescribed by the MD.

## 2023-04-16 ENCOUNTER — Inpatient Hospital Stay: Admitting: Family Medicine

## 2023-04-26 ENCOUNTER — Ambulatory Visit: Admitting: Family Medicine

## 2023-04-26 ENCOUNTER — Telehealth: Payer: Self-pay

## 2023-04-26 ENCOUNTER — Encounter: Payer: Self-pay | Admitting: Family Medicine

## 2023-04-26 VITALS — BP 148/89 | HR 98 | Resp 16 | Ht 67.0 in | Wt 157.4 lb

## 2023-04-26 DIAGNOSIS — E1140 Type 2 diabetes mellitus with diabetic neuropathy, unspecified: Secondary | ICD-10-CM | POA: Diagnosis not present

## 2023-04-26 DIAGNOSIS — I10 Essential (primary) hypertension: Secondary | ICD-10-CM | POA: Diagnosis not present

## 2023-04-26 DIAGNOSIS — E782 Mixed hyperlipidemia: Secondary | ICD-10-CM | POA: Diagnosis not present

## 2023-04-26 DIAGNOSIS — N400 Enlarged prostate without lower urinary tract symptoms: Secondary | ICD-10-CM

## 2023-04-26 DIAGNOSIS — Z7984 Long term (current) use of oral hypoglycemic drugs: Secondary | ICD-10-CM

## 2023-04-26 DIAGNOSIS — Z7985 Long-term (current) use of injectable non-insulin antidiabetic drugs: Secondary | ICD-10-CM

## 2023-04-26 MED ORDER — LISINOPRIL 20 MG PO TABS
20.0000 mg | ORAL_TABLET | Freq: Every day | ORAL | 1 refills | Status: AC
Start: 2023-04-26 — End: ?

## 2023-04-26 MED ORDER — ATORVASTATIN CALCIUM 40 MG PO TABS
40.0000 mg | ORAL_TABLET | Freq: Every day | ORAL | 1 refills | Status: AC
Start: 2023-04-26 — End: ?

## 2023-04-26 MED ORDER — TAMSULOSIN HCL 0.4 MG PO CAPS
0.4000 mg | ORAL_CAPSULE | Freq: Every day | ORAL | 1 refills | Status: AC
Start: 2023-04-26 — End: ?

## 2023-04-26 NOTE — Progress Notes (Signed)
 Date:  04/26/2023   Name:  Melvin FRETWELL Sr.   DOB:  1960/07/02   MRN:  [id number]   Chief Complaint: Hospitalization Follow-up  Hypertension This is a chronic problem. The current episode started more than 1 year ago. The problem has been gradually improving since onset. The problem is controlled. Pertinent negatives include no blurred vision, chest pain, headaches, malaise/fatigue, neck pain, orthopnea, palpitations, peripheral edema, PND, shortness of breath or sweats. There are no associated agents to hypertension. Past treatments include ACE inhibitors and calcium channel blockers. The current treatment provides moderate improvement. There are no compliance problems.  There is no history of CAD/MI or CVA. There is no history of chronic renal disease.  Hyperlipidemia This is a chronic problem. The current episode started more than 1 year ago. The problem is controlled. Recent lipid tests were reviewed and are normal. Exacerbating diseases include diabetes. He has no history of chronic renal disease, hypothyroidism, obesity or nephrotic syndrome. Pertinent negatives include no chest pain, focal sensory loss, focal weakness, leg pain, myalgias or shortness of breath. Current antihyperlipidemic treatment includes statins. The current treatment provides moderate improvement of lipids. There are no compliance problems.  Risk factors for coronary artery disease include post-menopausal, dyslipidemia, diabetes mellitus, male sex and hypertension.  Benign Prostatic Hypertrophy This is a chronic problem. The current episode started more than 1 year ago. The problem has been gradually improving since onset. Irritative symptoms include nocturia. Irritative symptoms do not include frequency or urgency. Obstructive symptoms do not include dribbling, incomplete emptying, an intermittent stream or a weak stream. Pertinent negatives include no chills, dysuria, hematuria or nausea. Past treatments include  tamsulosin. The treatment provided moderate relief.    Lab Results  Component Value Date   NA 138 04/10/2023   K 3.5 04/10/2023   CO2 28 04/10/2023   GLUCOSE 153 (H) 04/10/2023   BUN 17 04/10/2023   CREATININE 1.49 (H) 04/10/2023   CALCIUM 9.6 04/10/2023   EGFR 83 12/21/2022   GFRNONAA 52 (L) 04/10/2023   Lab Results  Component Value Date   CHOL 153 12/21/2022   HDL 49 12/21/2022   LDLCALC 88 12/21/2022   TRIG 83 12/21/2022   CHOLHDL 2.6 06/22/2022   Lab Results  Component Value Date   TSH 1.40 11/22/2020   Lab Results  Component Value Date   HGBA1C 9.3 06/19/2022   Lab Results  Component Value Date   WBC 10.8 (H) 04/10/2023   HGB 14.2 04/10/2023   HCT 41.2 04/10/2023   MCV 93.0 04/10/2023   PLT 485 (H) 04/10/2023   Lab Results  Component Value Date   ALT 21 04/10/2023   AST 18 04/10/2023   ALKPHOS 55 04/10/2023   BILITOT 0.6 04/10/2023   Lab Results  Component Value Date   VD25OH 29.6 (L) 04/12/2015     Review of Systems  Constitutional:  Negative for chills, fever and malaise/fatigue.  HENT:  Negative for drooling, ear discharge, ear pain and sore throat.   Eyes:  Negative for blurred vision.  Respiratory:  Negative for cough, shortness of breath and wheezing.   Cardiovascular:  Negative for chest pain, palpitations, orthopnea, leg swelling and PND.  Gastrointestinal:  Negative for abdominal pain, blood in stool, constipation, diarrhea and nausea.  Endocrine: Negative for polydipsia.  Genitourinary:  Positive for nocturia. Negative for difficulty urinating, dysuria, frequency, hematuria, incomplete emptying and urgency.  Musculoskeletal:  Negative for back pain, myalgias and neck pain.  Skin:  Negative for rash.  Allergic/Immunologic: Negative for environmental allergies.  Neurological:  Negative for dizziness, focal weakness and headaches.  Hematological:  Does not bruise/bleed easily.  Psychiatric/Behavioral:  Negative for suicidal ideas. The  patient is not nervous/anxious.     Patient Active Problem List   Diagnosis Date Noted   Benign prostatic hyperplasia without lower urinary tract symptoms 06/21/2017   Special screening for malignant neoplasms, colon    Benign neoplasm of transverse colon    Benign neoplasm of ascending colon    Obesity, Class I, BMI 30-34.9 07/12/2015   Overweight 04/12/2015   Left carotid stenosis 04/12/2015   Tinea corporis 10/03/2014   Type 2 diabetes mellitus with diabetic neuropathy, without long-term current use of insulin (HCC) 04/26/2014   HLD (hyperlipidemia) 04/26/2014   HTN (hypertension) 04/26/2014   H/O splenectomy 04/26/2014   Anterior circulation transient ischemic attack 04/26/2014   WPW (Wolff-Parkinson-White syndrome) 07/11/2012    No Known Allergies  Past Surgical History:  Procedure Laterality Date   CARDIAC CATHETERIZATION     over 20 yrs ago.  no issues per pt.   COLONOSCOPY WITH PROPOFOL N/A 02/12/2017   Procedure: COLONOSCOPY WITH PROPOFOL;  Surgeon: Marnee Sink, MD;  Location: [facility];  Service: Endoscopy;  Laterality: N/A;  Diabetic - oral meds   FOOT SURGERY     HERNIA REPAIR     x2   POLYPECTOMY  02/12/2017   Procedure: POLYPECTOMY;  Surgeon: Marnee Sink, MD;  Location: [facility];  Service: Endoscopy;;   SPLENECTOMY      Social History   Tobacco Use   Smoking status: Former    Current packs/day: 0.00    Types: Cigarettes    Quit date: 1997    Years since quitting: 28.3   Smokeless tobacco: Never  Vaping Use   Vaping status: Never Used  Substance Use Topics   Alcohol use: No    Alcohol/week: 0.0 standard drinks of alcohol   Drug use: No     Medication list has been reviewed and updated.  Current Meds  Medication Sig   amLODipine (NORVASC) 5 MG tablet Take 1 tablet (5 mg total) by mouth daily.   aspirin 81 MG tablet Take 81 mg by mouth daily.   atorvastatin (LIPITOR) 40 MG tablet Take 1 tablet (40 mg total) by mouth daily.    blood glucose meter kit and supplies Dispense based on patient and insurance preference. Use up to four times daily as directed. (FOR ICD-9 250.00, 250.01).   CONTOUR NEXT TEST test strip USE TO CHECK BLOOD SUGAR ONCE DAILY   lisinopril (ZESTRIL) 20 MG tablet Take 1 tablet (20 mg total) by mouth daily.   meloxicam (MOBIC) 15 MG tablet Take 1 tablet (15 mg total) by mouth daily.   metFORMIN (GLUCOPHAGE) 1000 MG tablet Take 1 tablet by mouth twice daily   MICROLET LANCETS MISC USE ONE  TO CHECK GLUCOSE ONCE DAILY   MOUNJARO 7.5 MG/0.5ML Pen Inject 7.5 mg into the skin once a week.   pioglitazone (ACTOS) 45 MG tablet TAKE 1 TABLET BY MOUTH ONCE DAILY. KEEP APPOINTMENT FOR MAY.   tamsulosin (FLOMAX) 0.4 MG CAPS capsule Take 1 capsule (0.4 mg total) by mouth daily.       12/21/2022    3:57 PM 06/22/2022    8:30 AM 01/06/2022    8:29 AM 12/22/2021    8:24 AM  GAD 7 : Generalized Anxiety Score  Nervous, Anxious, on Edge 0 0 0 0  Control/stop worrying 0 0 0 0  Worry too much - different things 0 0 0 0  Trouble relaxing 0 0 0 0  Restless 0 0 0 0  Easily annoyed or irritable 0 0 0 0  Afraid - awful might happen 0 0 0 0  Total GAD 7 Score 0 0 0 0  Anxiety Difficulty Not difficult at all Not difficult at all Not difficult at all Not difficult at all       12/21/2022    3:57 PM 06/22/2022    8:30 AM 01/06/2022    8:29 AM  Depression screen PHQ 2/9  Decreased Interest 0 0 0  Down, Depressed, Hopeless 0 0 0  PHQ - 2 Score 0 0 0  Altered sleeping 0 0 0  Tired, decreased energy 0 0 0  Change in appetite 0 0 0  Feeling bad or failure about yourself  0 0 0  Trouble concentrating 0 0 0  Moving slowly or fidgety/restless 0 0 0  Suicidal thoughts 0 0 0  PHQ-9 Score 0 0 0  Difficult doing work/chores Not difficult at all Not difficult at all Not difficult at all    BP Readings from Last 3 Encounters:  04/26/23 (!) 148/89  04/10/23 121/70  12/21/22 120/80    Physical Exam Vitals and  nursing note reviewed.  Constitutional:      Appearance: He is well-developed.  HENT:     Head: Normocephalic and atraumatic.     Right Ear: Tympanic membrane, ear canal and external ear normal.     Left Ear: Tympanic membrane, ear canal and external ear normal.     Nose: Nose normal. No congestion.     Mouth/Throat:     Mouth: Mucous membranes are moist.     Dentition: Normal dentition.  Eyes:     General: Lids are normal. No scleral icterus.    Conjunctiva/sclera: Conjunctivae normal.     Pupils: Pupils are equal, round, and reactive to light.  Neck:     Thyroid: No thyromegaly.     Vascular: No carotid bruit, hepatojugular reflux or JVD.     Trachea: No tracheal deviation.  Cardiovascular:     Rate and Rhythm: Normal rate and regular rhythm.     Heart sounds: Normal heart sounds. No murmur heard.    No friction rub. No gallop.  Pulmonary:     Effort: Pulmonary effort is normal.     Breath sounds: Normal breath sounds. No wheezing, rhonchi or rales.  Abdominal:     General: Bowel sounds are normal.     Palpations: Abdomen is soft. There is no hepatomegaly, splenomegaly or mass.     Tenderness: There is no abdominal tenderness.     Hernia: There is no hernia in the left inguinal area.  Genitourinary:    Prostate: Normal. Not enlarged, not tender and no nodules present.     Rectum: Normal. Guaiac result negative. No mass.  Musculoskeletal:        General: Normal range of motion.     Cervical back: Normal range of motion and neck supple.  Lymphadenopathy:     Cervical: No cervical adenopathy.  Skin:    General: Skin is warm and dry.     Findings: No rash.  Neurological:     Mental Status: He is alert and oriented to person, place, and time.     Sensory: No sensory deficit.     Deep Tendon Reflexes: Reflexes are normal and symmetric.  Psychiatric:  Mood and Affect: Mood is not anxious or depressed.     Wt Readings from Last 3 Encounters:  04/26/23 157 lb 6.4 oz  (71.4 kg)  12/21/22 164 lb 6.4 oz (74.6 kg)  06/22/22 185 lb (83.9 kg)    BP (!) 148/89   Pulse 98   Resp 16   Ht 5\' 7"  (1.702 m)   Wt 157 lb 6.4 oz (71.4 kg)   SpO2 99%   BMI 24.65 kg/m   Assessment and Plan:  1. Mixed hyperlipidemia Chronic.  Controlled.  Stable.  Asymptomatic.  Patient denies myalgias or muscle weakness.  Is tolerating atorvastatin 40 mg once a day.  Will check lipid panel for current LDL control and CMP for hepatic concerns.  Will recheck patient in 6 months. - atorvastatin (LIPITOR) 40 MG tablet; Take 1 tablet (40 mg total) by mouth daily.  Dispense: 90 tablet; Refill: 1 - Lipid Panel With LDL/HDL Ratio  2. Essential hypertension (Primary) Chronic.  Controlled.  Stable.  Asymptomatic.  Blood pressure 148/89.  Asymptomatic.  Patient had been on lisinopril 20 mg and atorvastatin 5 mg and was doing well but has been losing significant weight on Mounjaro and became near syncopal and was evaluated in ER setting was noted to be hypotensive.  Blood pressure medicine discontinued by patient and patient returns today with his elevated reading of 148/89.  Given that the patient is diabetic we will resume his lisinopril 20 mg once a day and hold his amlodipine 5 mg.  Patient will be having his blood pressure rechecked next month by Dr. Shelvy Dickens and is likely to change to primary care to keep everything underneath 1 umbrella for medical care.  Will check CMP for electrolytes and GFR. - lisinopril (ZESTRIL) 20 MG tablet; Take 1 tablet (20 mg total) by mouth daily.  Dispense: 90 tablet; Refill: 1 - Comprehensive metabolic panel with GFR  3. Benign prostatic hyperplasia without lower urinary tract symptoms Chronic.  Relatively controlled.  Stable.  Patient is asymptomatic with currently on tamsulosin 0.4 mg once a day.  We will continue at current dosing.  DRE was done with prostate noted to be normal size shape and consistency. - tamsulosin (FLOMAX) 0.4 MG CAPS capsule; Take 1  capsule (0.4 mg total) by mouth daily.  Dispense: 90 capsule; Refill: 1  4. Type 2 diabetes mellitus with diabetic neuropathy, without long-term current use of insulin (HCC) Chronic.  Controlled.  Followed by Dr. Marjory Signs clinic endocrinology.  We will check his microalbuminuria and will be resuming his lisinopril for protection of diabetic nephropathy.  In the meantime patient has also had ophthalmology with Dr. Russ Course and we need to find results of that - Microalbumin / creatinine urine ratio    Alayne Allis, MD

## 2023-04-26 NOTE — Telephone Encounter (Signed)
 Called [facility] and confirmed patient has not had Diabetic Eye Exam as he did not show for his appt 04/11/23.

## 2023-04-27 ENCOUNTER — Encounter: Payer: Self-pay | Admitting: Family Medicine

## 2023-04-27 LAB — COMPREHENSIVE METABOLIC PANEL WITH GFR
ALT: 13 IU/L (ref 0–44)
AST: 15 IU/L (ref 0–40)
Albumin: 4.1 g/dL (ref 3.9–4.9)
Alkaline Phosphatase: 93 IU/L (ref 44–121)
BUN/Creatinine Ratio: 8 — ABNORMAL LOW (ref 10–24)
BUN: 9 mg/dL (ref 8–27)
Bilirubin Total: <0.2 mg/dL (ref 0.0–1.2)
CO2: 25 mmol/L (ref 20–29)
Calcium: 10.2 mg/dL (ref 8.6–10.2)
Chloride: 101 mmol/L (ref 96–106)
Creatinine, Ser: 1.11 mg/dL (ref 0.76–1.27)
Globulin, Total: 2.8 g/dL (ref 1.5–4.5)
Glucose: 122 mg/dL — ABNORMAL HIGH (ref 70–99)
Potassium: 4.4 mmol/L (ref 3.5–5.2)
Sodium: 139 mmol/L (ref 134–144)
Total Protein: 6.9 g/dL (ref 6.0–8.5)
eGFR: 75 mL/min/{1.73_m2} (ref >59)

## 2023-04-27 LAB — LIPID PANEL WITH LDL/HDL RATIO
Cholesterol, Total: 131 mg/dL (ref 100–199)
HDL: 45 mg/dL (ref >39)
LDL Chol Calc (NIH): 66 mg/dL (ref 0–99)
LDL/HDL Ratio: 1.5 ratio (ref 0.0–3.6)
Triglycerides: 111 mg/dL (ref 0–149)
VLDL Cholesterol Cal: 20 mg/dL (ref 5–40)

## 2023-05-08 ENCOUNTER — Other Ambulatory Visit: Payer: Self-pay | Admitting: Family Medicine

## 2023-06-10 ENCOUNTER — Ambulatory Visit: Payer: Self-pay | Admitting: Family Medicine
